# Patient Record
Sex: Female | Born: 1985 | Race: White | Hispanic: No | Marital: Single | Smoking: Never smoker
Health system: Southern US, Community
[De-identification: ages and names within clinical notes are randomized; demographics above are authoritative.]

## PROBLEM LIST (undated history)

## (undated) DIAGNOSIS — Z87442 Personal history of urinary calculi: Secondary | ICD-10-CM

## (undated) DIAGNOSIS — Z9889 Other specified postprocedural states: Secondary | ICD-10-CM

## (undated) DIAGNOSIS — J45909 Unspecified asthma, uncomplicated: Secondary | ICD-10-CM

## (undated) DIAGNOSIS — Z973 Presence of spectacles and contact lenses: Secondary | ICD-10-CM

## (undated) DIAGNOSIS — M1990 Unspecified osteoarthritis, unspecified site: Secondary | ICD-10-CM

## (undated) DIAGNOSIS — Z8679 Personal history of other diseases of the circulatory system: Secondary | ICD-10-CM

## (undated) DIAGNOSIS — N200 Calculus of kidney: Secondary | ICD-10-CM

## (undated) DIAGNOSIS — Z8742 Personal history of other diseases of the female genital tract: Secondary | ICD-10-CM

## (undated) HISTORY — PX: MYRINGOTOMY WITH TUBE PLACEMENT: SHX5663

## (undated) HISTORY — DX: Unspecified asthma, uncomplicated: J45.909

## (undated) HISTORY — PX: CARDIAC ELECTROPHYSIOLOGY MAPPING AND ABLATION: SHX1292

## (undated) HISTORY — PX: WISDOM TOOTH EXTRACTION: SHX21

---

## 2009-01-05 ENCOUNTER — Emergency Department (HOSPITAL_COMMUNITY): Admission: EM | Admit: 2009-01-05 | Discharge: 2009-01-06 | Payer: Self-pay | Admitting: Emergency Medicine

## 2009-08-13 ENCOUNTER — Encounter: Admission: RE | Admit: 2009-08-13 | Discharge: 2009-10-03 | Payer: Self-pay | Admitting: Obstetrics and Gynecology

## 2009-10-06 ENCOUNTER — Encounter: Admission: RE | Admit: 2009-10-06 | Discharge: 2009-10-13 | Payer: Self-pay | Admitting: Obstetrics and Gynecology

## 2009-10-21 ENCOUNTER — Inpatient Hospital Stay (HOSPITAL_COMMUNITY): Admission: AD | Admit: 2009-10-21 | Discharge: 2009-10-21 | Payer: Self-pay | Admitting: Obstetrics and Gynecology

## 2009-12-18 ENCOUNTER — Observation Stay (HOSPITAL_COMMUNITY): Admission: AD | Admit: 2009-12-18 | Discharge: 2009-12-20 | Payer: Self-pay | Admitting: Obstetrics and Gynecology

## 2009-12-25 ENCOUNTER — Inpatient Hospital Stay (HOSPITAL_COMMUNITY): Admission: AD | Admit: 2009-12-25 | Discharge: 2009-12-25 | Payer: Self-pay | Admitting: Obstetrics and Gynecology

## 2010-01-01 ENCOUNTER — Inpatient Hospital Stay (HOSPITAL_COMMUNITY): Admission: AD | Admit: 2010-01-01 | Discharge: 2010-01-05 | Payer: Self-pay | Admitting: Obstetrics and Gynecology

## 2010-01-02 ENCOUNTER — Encounter (INDEPENDENT_AMBULATORY_CARE_PROVIDER_SITE_OTHER): Payer: Self-pay | Admitting: Obstetrics and Gynecology

## 2010-10-04 HISTORY — PX: OTHER SURGICAL HISTORY: SHX169

## 2010-10-06 ENCOUNTER — Encounter: Admission: RE | Admit: 2010-10-06 | Payer: Self-pay | Source: Home / Self Care | Admitting: Internal Medicine

## 2010-11-04 ENCOUNTER — Encounter: Payer: Self-pay | Admitting: Internal Medicine

## 2010-12-21 LAB — URINALYSIS, ROUTINE W REFLEX MICROSCOPIC
Bilirubin Urine: NEGATIVE
Hgb urine dipstick: NEGATIVE
Nitrite: NEGATIVE
Specific Gravity, Urine: 1.01 (ref 1.005–1.030)

## 2010-12-23 LAB — CBC
Hemoglobin: 9.3 g/dL — ABNORMAL LOW (ref 12.0–15.0)
MCHC: 34.1 g/dL (ref 30.0–36.0)
RDW: 15.6 % — ABNORMAL HIGH (ref 11.5–15.5)
WBC: 14.2 10*3/uL — ABNORMAL HIGH (ref 4.0–10.5)

## 2010-12-28 LAB — CBC
MCHC: 33 g/dL (ref 30.0–36.0)
MCV: 87.2 fL (ref 78.0–100.0)
MCV: 88.2 fL (ref 78.0–100.0)
Platelets: 215 10*3/uL (ref 150–400)
RBC: 3.61 MIL/uL — ABNORMAL LOW (ref 3.87–5.11)
RDW: 14.6 % (ref 11.5–15.5)
WBC: 9.4 10*3/uL (ref 4.0–10.5)

## 2010-12-28 LAB — LACTATE DEHYDROGENASE: LDH: 122 U/L (ref 94–250)

## 2010-12-28 LAB — RPR: RPR Ser Ql: NONREACTIVE

## 2010-12-28 LAB — COMPREHENSIVE METABOLIC PANEL
ALT: 23 U/L (ref 0–35)
AST: 27 U/L (ref 0–37)
Albumin: 2.4 g/dL — ABNORMAL LOW (ref 3.5–5.2)
Alkaline Phosphatase: 105 U/L (ref 39–117)
BUN: 8 mg/dL (ref 6–23)
CO2: 22 mEq/L (ref 19–32)
GFR calc Af Amer: >60 mL/min (ref >60)
GFR calc non Af Amer: >60 mL/min (ref >60)
Potassium: 3.6 mEq/L (ref 3.5–5.1)

## 2011-01-13 LAB — URINALYSIS, ROUTINE W REFLEX MICROSCOPIC
Glucose, UA: NEGATIVE mg/dL
Specific Gravity, Urine: 1.013 (ref 1.005–1.030)
pH: 7.5 (ref 5.0–8.0)

## 2011-01-13 LAB — POCT I-STAT, CHEM 8
BUN: 7 mg/dL (ref 6–23)
Creatinine, Ser: 0.9 mg/dL (ref 0.4–1.2)
Glucose, Bld: 94 mg/dL (ref 70–99)
Hemoglobin: 12.6 g/dL (ref 12.0–15.0)
Sodium: 141 mEq/L (ref 135–145)
TCO2: 21 mmol/L (ref 0–100)

## 2011-01-13 LAB — PREGNANCY, URINE: Preg Test, Ur: NEGATIVE

## 2011-01-13 LAB — RAPID URINE DRUG SCREEN, HOSP PERFORMED: Cocaine: NOT DETECTED

## 2011-01-13 LAB — URINE MICROSCOPIC-ADD ON

## 2011-03-10 ENCOUNTER — Emergency Department (HOSPITAL_COMMUNITY)
Admission: EM | Admit: 2011-03-10 | Discharge: 2011-03-10 | Disposition: A | Discharge status: Home / Self Care | Payer: Medicaid Other | Attending: Emergency Medicine | Admitting: Emergency Medicine

## 2011-03-10 DIAGNOSIS — R059 Cough, unspecified: Secondary | ICD-10-CM | POA: Insufficient documentation

## 2011-03-10 DIAGNOSIS — J45909 Unspecified asthma, uncomplicated: Secondary | ICD-10-CM | POA: Insufficient documentation

## 2011-03-10 DIAGNOSIS — R05 Cough: Secondary | ICD-10-CM | POA: Insufficient documentation

## 2013-10-01 ENCOUNTER — Ambulatory Visit (INDEPENDENT_AMBULATORY_CARE_PROVIDER_SITE_OTHER): Payer: BC Managed Care – PPO | Admitting: Family Medicine

## 2013-10-01 VITALS — BP 99/69 | HR 108 | Temp 99.0°F | Resp 16 | Ht 63.5 in | Wt 179.0 lb

## 2013-10-01 DIAGNOSIS — J101 Influenza due to other identified influenza virus with other respiratory manifestations: Secondary | ICD-10-CM

## 2013-10-01 DIAGNOSIS — J111 Influenza due to unidentified influenza virus with other respiratory manifestations: Secondary | ICD-10-CM

## 2013-10-01 DIAGNOSIS — R509 Fever, unspecified: Secondary | ICD-10-CM

## 2013-10-01 MED ORDER — ONDANSETRON 4 MG PO TBDP
4.0000 mg | ORAL_TABLET | Freq: Once | ORAL | Status: AC
  Administered 2013-10-01: 4 mg via ORAL

## 2013-10-01 MED ORDER — OSELTAMIVIR PHOSPHATE 75 MG PO CAPS: 75.0000 mg | ORAL_CAPSULE | Freq: Two times a day (BID) | ORAL | Status: DC

## 2013-10-01 MED ORDER — HYDROCOD POLST-CHLORPHEN POLST 10-8 MG/5ML PO LQCR: 5.0000 mL | Freq: Two times a day (BID) | ORAL | Status: DC | PRN

## 2013-10-01 MED ORDER — IPRATROPIUM BROMIDE 0.03 % NA SOLN: 2.0000 | Freq: Four times a day (QID) | NASAL | Status: DC

## 2013-10-01 MED ORDER — PROMETHAZINE HCL 25 MG PO TABS: 25.0000 mg | ORAL_TABLET | Freq: Three times a day (TID) | ORAL | Status: DC | PRN

## 2013-10-01 MED ORDER — PSEUDOEPHEDRINE HCL ER 120 MG PO TB12: 120.0000 mg | ORAL_TABLET | Freq: Two times a day (BID) | ORAL | Status: DC

## 2013-10-01 NOTE — Patient Instructions (Signed)

## 2013-10-01 NOTE — Progress Notes (Signed)
Subjective:  This chart was scribed for Stefanie Sorenson, MD by Carl Best, Medical Scribe. This patient was seen in Room 10 and the patient's care was started at 9:53 AM.   Patient ID: Stefanie Everett, female    DOB: [birth year], 27 y.o.   MRN: [id number] Chief Complaint  Patient presents with  . Fever    x 2 days ; has been exposed to flu; has tried mucinex flu and cold   . Generalized Body Aches  . Cough    non productive   . Nausea    HPI HPI Comments: Stefanie Everett is a 27 y.o. female who presents to the [facility] complaining of fatigue, and myalgias that started two days ago.  She states that last night she had an episode of emesis after dinner.  She states that she woke up this morning with a fever of 101.3 degrees and chills.  She also has swollen glands, headache, nasal congestion, and non-productive cough.  No other GI/GU prob.  She states that she has not tried eating or drinking anything because she is afraid that she will be unable to keep it down.  She states that her daughter woke up with a fever this morning as well.  She states that her husband was treated at [facility] two days ago and was tested positive for Influenza.    She states that the last time she had a syncopal episode was in July when she was not feeling well.   Past Medical History  Diagnosis Date  . Allergy   . Anemia    Past Surgical History  Procedure Laterality Date  . Cesarean section  2011  . Heart ablations  2001   Family History  Problem Relation Age of Onset  . Diabetes Mother   . Hyperlipidemia Mother   . Hypertension Mother   . Emphysema Mother   . Diabetes Maternal Grandmother   . Hyperlipidemia Maternal Grandmother   . Hypertension Maternal Grandmother   . Hyperlipidemia Paternal Grandmother   . Hypertension Paternal Grandmother   . Heart disease Paternal Grandfather    History   Social History  . Marital Status: Single    Spouse Name: N/A    Number of Children: N/A  .  Years of Education: N/A   Occupational History  . Not on file.   Social History Main Topics  . Smoking status: Never Smoker   . Smokeless tobacco: Not on file  . Alcohol Use: No  . Drug Use: No  . Sexual Activity: Not on file   Other Topics Concern  . Not on file   Social History Narrative  . No narrative on file   Allergies  Allergen Reactions  . Penicillins Swelling  . Toradol [Ketorolac Tromethamine] Swelling    Review of Systems  Constitutional: Positive for fever, chills, diaphoresis, activity change, appetite change and fatigue. Negative for unexpected weight change.  HENT: Positive for congestion, postnasal drip and rhinorrhea.   Respiratory: Positive for cough.   Gastrointestinal: Positive for nausea and vomiting. Negative for abdominal pain and diarrhea.  Genitourinary: Negative for dysuria, decreased urine volume and difficulty urinating.  Musculoskeletal: Positive for arthralgias and myalgias.  Neurological: Positive for syncope and headaches.  Hematological: Positive for adenopathy.     BP 118/72  Pulse 106  Temp(Src) 99 F (37.2 C) (Oral)  Resp 16  Ht 5' 3.5" (1.613 m)  Wt 179 lb (81.194 kg)  BMI 31.21 kg/m2  SpO2 99% Objective:  Physical  Exam  Nursing note and vitals reviewed. Constitutional: She is oriented to person, place, and time. She appears well-developed and well-nourished. No distress.  HENT:  Head: Normocephalic and atraumatic.  Right Ear: Hearing, external ear and ear canal normal. Tympanic membrane is scarred. A middle ear effusion is present.  Left Ear: Hearing, external ear and ear canal normal. Tympanic membrane is scarred. A middle ear effusion is present.  Nose: Mucosal edema and rhinorrhea present.  Mouth/Throat: Uvula is midline and oropharynx is clear and moist.  Eyes: Conjunctivae are normal. Pupils are equal, round, and reactive to light.  Neck: Normal range of motion. Neck supple. No thyromegaly present.  Cardiovascular:  Regular rhythm and normal heart sounds.  Tachycardia present.  Exam reveals no gallop and no friction rub.   No murmur heard. Pulmonary/Chest: Effort normal and breath sounds normal. No respiratory distress. She has no decreased breath sounds. She has no wheezes. She has no rhonchi. She has no rales.  Lymphadenopathy:    She has no cervical adenopathy.  Neurological: She is alert and oriented to person, place, and time.  Skin: Skin is warm and dry.  Psychiatric: She has a normal mood and affect. Her behavior is normal.    Results for orders placed in visit on 10/01/13  POCT INFLUENZA A/B      Result Value Range   Influenza A, POC Positive     Influenza B, POC Negative     Had a vasovagal episode after the flu test - recovered spontaneously and husband came to pick her up from clinic.  Assessment & Plan:   Fever, unspecified - Plan: POCT Influenza A/B, ondansetron (ZOFRAN-ODT) disintegrating tablet 4 mg  Influenza A - Plan: ondansetron (ZOFRAN-ODT) disintegrating tablet 4 mg  Meds ordered this encounter  Medications  . albuterol (PROVENTIL) (2.5 MG/3ML) 0.083% nebulizer solution    Sig: Take 2.5 mg by nebulization every 6 (six) hours as needed for wheezing or shortness of breath.  . chlorpheniramine-HYDROcodone (TUSSIONEX PENNKINETIC ER) 10-8 MG/5ML LQCR    Sig: Take 5 mLs by mouth every 12 (twelve) hours as needed.    Dispense:  120 mL    Refill:  0  . pseudoephedrine (SUDAFED 12 HOUR) 120 MG 12 hr tablet    Sig: Take 1 tablet (120 mg total) by mouth 2 (two) times daily.    Dispense:  30 tablet    Refill:  0  . ipratropium (ATROVENT) 0.03 % nasal spray    Sig: Place 2 sprays into the nose 4 (four) times daily.    Dispense:  30 mL    Refill:  1  . oseltamivir (TAMIFLU) 75 MG capsule    Sig: Take 1 capsule (75 mg total) by mouth 2 (two) times daily.    Dispense:  10 capsule    Refill:  0  . promethazine (PHENERGAN) 25 MG tablet    Sig: Take 1 tablet (25 mg total) by mouth  every 8 (eight) hours as needed for nausea or vomiting.    Dispense:  20 tablet    Refill:  0  . ondansetron (ZOFRAN-ODT) disintegrating tablet 4 mg    Sig:     I personally performed the services described in this documentation, which was scribed in my presence. The recorded information has been reviewed and considered, and addended by me as needed.  Stefanie Sorenson, MD MPH

## 2013-10-29 ENCOUNTER — Ambulatory Visit (INDEPENDENT_AMBULATORY_CARE_PROVIDER_SITE_OTHER): Payer: BC Managed Care – PPO | Admitting: Physician Assistant

## 2013-10-29 VITALS — BP 116/70 | HR 97 | Temp 98.3°F | Resp 18 | Ht 63.5 in | Wt 180.0 lb

## 2013-10-29 DIAGNOSIS — R05 Cough: Secondary | ICD-10-CM

## 2013-10-29 DIAGNOSIS — R059 Cough, unspecified: Secondary | ICD-10-CM

## 2013-10-29 DIAGNOSIS — J029 Acute pharyngitis, unspecified: Secondary | ICD-10-CM

## 2013-10-29 LAB — POCT RAPID STREP A (OFFICE): Rapid Strep A Screen: NEGATIVE

## 2013-10-29 MED ORDER — BENZONATATE 100 MG PO CAPS: 100.0000 mg | ORAL_CAPSULE | Freq: Three times a day (TID) | ORAL | Status: DC | PRN

## 2013-10-29 MED ORDER — AZITHROMYCIN 250 MG PO TABS: ORAL_TABLET | ORAL | Status: DC

## 2013-10-29 NOTE — Patient Instructions (Signed)
Get plenty of rest and drink at least 64 ounces of water daily. 

## 2013-10-29 NOTE — Progress Notes (Signed)
Subjective:    Patient ID: Stefanie Everett, female    DOB: April 12, 1986, 28 y.o.   MRN: [id number]  PCP: No primary provider on file.  Chief Complaint  Patient presents with  . Sore Throat    x3 days   . Cough  . Migraine  Medications, allergies, past medical history, surgical history, family history, social history and problem list reviewed and updated.   HPI  Diagnosed with influenza A on 10/01/2013. Symptoms resolved.  ABout 3 days ago developed severe sore throat.  "Feels like I'm swallowing rocks."  No fever.  A little achy. No chills.  No nausea. She has a cough that's non-productive, a little bit of a runny nose.  Her boyfriend was diagnosed with a bacterial pharyngitis yesterday at another facility.  She believes that she has the same thing.  Review of Systems No GI/GU symptoms.  As above.    Objective:   Physical Exam  Vitals reviewed. Constitutional: She is oriented to person, place, and time. Vital signs are normal. She appears well-developed and well-nourished. No distress.  HENT:  Head: Normocephalic and atraumatic.  Right Ear: Hearing, tympanic membrane, external ear and ear canal normal.  Left Ear: Hearing, tympanic membrane, external ear and ear canal normal.  Nose: Mucosal edema and rhinorrhea present.  No foreign bodies. Right sinus exhibits no maxillary sinus tenderness and no frontal sinus tenderness. Left sinus exhibits no maxillary sinus tenderness and no frontal sinus tenderness.  Mouth/Throat: Uvula is midline, oropharynx is clear and moist and mucous membranes are normal. No uvula swelling. No oropharyngeal exudate.  Eyes: Conjunctivae and EOM are normal. Pupils are equal, round, and reactive to light. Right eye exhibits no discharge. Left eye exhibits no discharge. No scleral icterus.  Neck: Trachea normal, normal range of motion and full passive range of motion without pain. Neck supple. No mass and no thyromegaly present.  Cardiovascular: Normal rate,  regular rhythm and normal heart sounds.   Pulmonary/Chest: Effort normal and breath sounds normal.  Lymphadenopathy:       Head (right side): No submandibular, no tonsillar, no preauricular, no posterior auricular and no occipital adenopathy present.       Head (left side): No submandibular, no tonsillar, no preauricular and no occipital adenopathy present.    She has no cervical adenopathy.       Right: No supraclavicular adenopathy present.       Left: No supraclavicular adenopathy present.  Neurological: She is alert and oriented to person, place, and time. She has normal strength. No cranial nerve deficit or sensory deficit.  Skin: Skin is warm, dry and intact. No rash noted.  Psychiatric: She has a normal mood and affect. Her speech is normal and behavior is normal.      Results for orders placed in visit on 10/29/13  POCT RAPID STREP A (OFFICE)      Result Value Range   Rapid Strep A Screen Negative  Negative       Assessment & Plan:  1. Acute pharyngitis Await culture.  Suspect viral URI, but given her exposure to possible strep, agree to cover with antibiotic.  PCN allergic. - POCT rapid strep A - Culture, Group A Strep - azithromycin (ZITHROMAX) 250 MG tablet; Take 2 tabs PO x 1 dose, then 1 tab PO QD x 4 days  Dispense: 6 tablet; Refill: 0  2. Cough Likely due to post-nasal drainage from URI - benzonatate (TESSALON) 100 MG capsule; Take 1-2 capsules (100-200 mg total) by mouth  3 (three) times daily as needed for cough.  Dispense: 40 capsule; Refill: 0   Fernande Brashelle S. Andromeda Poppen, PA-C Physician Assistant-Certified [facility]

## 2013-11-01 LAB — CULTURE, GROUP A STREP: Organism ID, Bacteria: NORMAL

## 2014-08-12 ENCOUNTER — Ambulatory Visit (INDEPENDENT_AMBULATORY_CARE_PROVIDER_SITE_OTHER): Payer: BC Managed Care – PPO | Admitting: Physician Assistant

## 2014-08-12 VITALS — BP 116/74 | HR 77 | Temp 98.2°F | Resp 16 | Ht 64.5 in | Wt 180.6 lb

## 2014-08-12 DIAGNOSIS — J029 Acute pharyngitis, unspecified: Secondary | ICD-10-CM

## 2014-08-12 DIAGNOSIS — G44209 Tension-type headache, unspecified, not intractable: Secondary | ICD-10-CM

## 2014-08-12 DIAGNOSIS — R112 Nausea with vomiting, unspecified: Secondary | ICD-10-CM

## 2014-08-12 MED ORDER — ONDANSETRON 4 MG PO TBDP: 4.0000 mg | ORAL_TABLET | Freq: Three times a day (TID) | ORAL | Status: DC | PRN

## 2014-08-12 NOTE — Progress Notes (Signed)
I have discussed this case with Mr. McVeigh, PA-C and agree.  

## 2014-08-12 NOTE — Patient Instructions (Signed)
I do not think your sore throat or abdominal issues are due to a bacterial infection.  For your sore throat and headache, cycle between aleve and tylenol every 4 hours as needed. Take the zofran as needed as prescribed for the nausea/vomiting. Try to rest and stay hydrated as much as possible! 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 If you're not feeling better in 5 days please return to clinic. If your vomiting is not easing up please return to clinic in 48 hours.

## 2014-08-12 NOTE — Progress Notes (Signed)
Subjective:    Patient ID: Stefanie Everett, female    DOB: 07/22/1986, 28 y.o.   MRN: [id number]  No PCP Per Patient  Chief Complaint  Patient presents with  . Sore Throat  . Emesis  . Headache   There are no active problems to display for this patient.  Prior to Admission medications   Medication Sig Start Date End Date Taking? Authorizing Provider  albuterol (PROVENTIL) (2.5 MG/3ML) 0.083% nebulizer solution Take 2.5 mg by nebulization every 6 (six) hours as needed for wheezing or shortness of breath.   Yes Historical Provider, MD  ondansetron (ZOFRAN ODT) 4 MG disintegrating tablet Take 1 tablet (4 mg total) by mouth every 8 (eight) hours as needed for nausea or vomiting. 08/12/14   Raelyn Ensignodd Jimmye Wisnieski, PA   Medications, allergies, past medical history, surgical history, family history, social history and problem list reviewed and updated.  HPI  89 yof with no sig PMH presents today with sore throat, HA, and vomiting.  Sx initially began with sore throat 4-5 days ago. She also had some assoc head congestion. Denies ear pain, runny nose, cough. One temp at home elevated at 100.0, otherwise she has been without fever or chills. Her sore throat eased off after 1-2 days but returned last night when she started vomiting. Daughter was sick last week at home.   Vomiting - Ate at a fast food restaurant yest evening. About 1.5 hrs later was driving home and had to pull over to vomit in a parking lot. She has had 2 episodes of vomiting today. Non-bloody,non-billious. She ate crackers several hrs ago and was able to keep these down. Upset stomach but denies actual abd pain, diarrhea.   HA - Started last night after vomiting. Has been located across her head, rated 5/10 at worst. Relieved with aleve yest but has come back today. No fever since HA began, no vision changes, photo/phonophobia.   Review of Systems No CP, SOB, chills, dysuria. See HPI.     Objective:   Physical Exam  Constitutional: She  is oriented to person, place, and time. She appears well-developed and well-nourished.  Non-toxic appearance. She does not have a sickly appearance. She appears ill. No distress.  BP 116/74 mmHg  Pulse 77  Temp(Src) 98.2 F (36.8 C) (Oral)  Resp 16  Ht 5' 4.5" (1.638 m)  Wt 180 lb 9.6 oz (81.92 kg)  BMI 30.53 kg/m2  SpO2 100%  LMP 07/21/2014   HENT:  Head: Normocephalic and atraumatic.  Right Ear: Hearing, tympanic membrane, external ear and ear canal normal.  Left Ear: Hearing, tympanic membrane, external ear and ear canal normal.  Nose: Nose normal. No mucosal edema or rhinorrhea. Right sinus exhibits no maxillary sinus tenderness and no frontal sinus tenderness. Left sinus exhibits no maxillary sinus tenderness and no frontal sinus tenderness.  Mouth/Throat: Uvula is midline, oropharynx is clear and moist and mucous membranes are normal. No oropharyngeal exudate, posterior oropharyngeal edema, posterior oropharyngeal erythema or tonsillar abscesses.  Eyes: Conjunctivae and EOM are normal. Pupils are equal, round, and reactive to light.  Neck: No Brudzinski's sign noted.  Cardiovascular: Normal rate, regular rhythm and normal heart sounds.  Exam reveals no gallop.   No murmur heard. Pulmonary/Chest: Effort normal. She has no decreased breath sounds. She has no wheezes. She has no rhonchi. She has no rales.  Abdominal: Soft. Normal appearance and bowel sounds are normal. There is no tenderness.  Lymphadenopathy:       Head (right side):  No submental, no submandibular and no tonsillar adenopathy present.       Head (left side): No submental, no submandibular and no tonsillar adenopathy present.    She has no cervical adenopathy.  Neurological: She is alert and oriented to person, place, and time.  Psychiatric: She has a normal mood and affect. Her speech is normal.      Assessment & Plan:   [age] yof with no sig PMH presents today with sore throat, HA, and vomiting.  Tension  headache --Most likely from vomiting, no concerning features --Tylenol/aleve  --RTC 5 days if not resolving  Nausea and vomiting, vomiting of unspecified type - Plan: ondansetron (ZOFRAN ODT) 4 MG disintegrating tablet --Food poisoning vs gastroenteritis --Zofran prn --Rest/fluids --RTC 48 hrs if not resolving  Sore throat --PE unconcerning for strep --Tylenol/nsaids/fluids/rest --RTC 5 days if not resolving  Donnajean Lopesodd M. Alexsys Eskin, PA-C Physician Assistant-Certified [facility]  08/12/2014 2:20 PM

## 2014-08-13 ENCOUNTER — Ambulatory Visit (INDEPENDENT_AMBULATORY_CARE_PROVIDER_SITE_OTHER): Payer: BC Managed Care – PPO | Admitting: Internal Medicine

## 2014-08-13 ENCOUNTER — Ambulatory Visit (INDEPENDENT_AMBULATORY_CARE_PROVIDER_SITE_OTHER): Payer: BC Managed Care – PPO

## 2014-08-13 VITALS — BP 118/82 | HR 89 | Temp 98.3°F | Resp 16 | Ht 64.5 in | Wt 179.6 lb

## 2014-08-13 DIAGNOSIS — R1032 Left lower quadrant pain: Secondary | ICD-10-CM

## 2014-08-13 DIAGNOSIS — R319 Hematuria, unspecified: Secondary | ICD-10-CM

## 2014-08-13 LAB — POCT URINALYSIS DIPSTICK
Bilirubin, UA: NEGATIVE
Glucose, UA: NEGATIVE
KETONES UA: NEGATIVE
Leukocytes, UA: NEGATIVE
Nitrite, UA: NEGATIVE
PROTEIN UA: 100
Urobilinogen, UA: 0.2
pH, UA: 5.5

## 2014-08-13 LAB — COMPREHENSIVE METABOLIC PANEL
ALT: 12 U/L (ref 0–35)
AST: 14 U/L (ref 0–37)
Albumin: 4 g/dL (ref 3.5–5.2)
Alkaline Phosphatase: 74 U/L (ref 39–117)
BUN: 10 mg/dL (ref 6–23)
CO2: 24 meq/L (ref 19–32)
Calcium: 9.1 mg/dL (ref 8.4–10.5)
Chloride: 103 mEq/L (ref 96–112)
Creat: 0.75 mg/dL (ref 0.50–1.10)
GLUCOSE: 76 mg/dL (ref 70–99)
Potassium: 4.1 mEq/L (ref 3.5–5.3)
SODIUM: 137 meq/L (ref 135–145)
TOTAL PROTEIN: 7.1 g/dL (ref 6.0–8.3)
Total Bilirubin: 0.7 mg/dL (ref 0.2–1.2)

## 2014-08-13 LAB — POCT CBC
Granulocyte percent: 65.7 %G (ref 37–80)
HEMATOCRIT: 38.7 % (ref 37.7–47.9)
HEMOGLOBIN: 12.1 g/dL — AB (ref 12.2–16.2)
Lymph, poc: 2.9 (ref 0.6–3.4)
MCH, POC: 27 pg (ref 27–31.2)
MCHC: 31.3 g/dL — AB (ref 31.8–35.4)
MCV: 86.3 fL (ref 80–97)
MID (cbc): 0.4 (ref 0–0.9)
MPV: 8.6 fL (ref 0–99.8)
POC Granulocyte: 6.3 (ref 2–6.9)
POC LYMPH PERCENT: 30.4 %L (ref 10–50)
POC MID %: 3.9 %M (ref 0–12)
Platelet Count, POC: 252 10*3/uL (ref 142–424)
RBC: 4.48 M/uL (ref 4.04–5.48)
RDW, POC: 14.4 %
WBC: 9.6 10*3/uL (ref 4.6–10.2)

## 2014-08-13 LAB — POCT UA - MICROSCOPIC ONLY
CRYSTALS, UR, HPF, POC: NEGATIVE
Casts, Ur, LPF, POC: NEGATIVE
MUCUS UA: NEGATIVE
Yeast, UA: NEGATIVE

## 2014-08-13 MED ORDER — HYDROCODONE-ACETAMINOPHEN 5-325 MG PO TABS: 1.0000 | ORAL_TABLET | Freq: Four times a day (QID) | ORAL | Status: DC | PRN

## 2014-08-13 NOTE — Progress Notes (Addendum)
Subjective:  This chart was scribed for [name], MD by Carl Bestelina Holson, Medical Scribe. This patient was seen in Room 11 and the patient's care was started at 10:44 AM.   Patient ID: Stefanie Everett, female    DOB: 10/18/1985, [age] y.o.   MRN: [id number]  HPI HPI Comments: Stefanie Everett is a 28 y.o. female who presents to the [facility] for a follow-up concerning appearance of hematuria.  She was here yesterday for her symptoms and was discharged with Zofran and instructions to follow-up in 24 hours.  Last night she noticed some mild hematuria and pain in her left flank.  She is still complaining of the headache she had yesterday but states that it has worsened.  Her headache is not affecting her vision.  She vomited twice last night and used Zofran with relief to her symptoms.  She denies fever, frequency, and dysuria as associated symptoms.  She does not have a history of kidney stones but states that both of her parents have a history of kidney stones.     Past Medical History  Diagnosis Date   Allergy    Anemia    Past Surgical History  Procedure Laterality Date   Cesarean section  2011   Heart ablations  2001   Myringotomy with tube placement     Dilation and curettage of uterus     Laparoscopic abdominal exploration      endometriosis   Family History  Problem Relation Age of Onset   Diabetes Mother    Hyperlipidemia Mother    Hypertension Mother    Emphysema Mother    Diabetes Maternal Grandmother    Hyperlipidemia Maternal Grandmother    Hypertension Maternal Grandmother    Hyperlipidemia Paternal Grandmother    Hypertension Paternal Grandmother    Heart disease Paternal Grandfather    History   Social History   Marital Status: Single    Spouse Name: n/a    Number of Children: 1   Years of Education: N/A   Occupational History   Development worker, communityfinancial services representative     [employer]   Social History Main  Topics   Smoking status: Never Smoker    Smokeless tobacco: Never Used   Alcohol Use: No   Drug Use: No   Sexual Activity: Not on file   Other Topics Concern   Not on file   Social History Narrative   Lives with her boyfriend and their daughter.   Allergies  Allergen Reactions   Penicillins Swelling   Toradol [Ketorolac Tromethamine] Swelling    Review of Systems  Constitutional: Negative for fever.  Gastrointestinal: Positive for vomiting.  Genitourinary: Positive for hematuria and decreased urine volume. Negative for dysuria and frequency.  Neurological: Positive for headaches.     Objective:  Physical Exam  Constitutional: She is oriented to person, place, and time. She appears well-developed and well-nourished.  HENT:  Head: Normocephalic and atraumatic.  Eyes: Conjunctivae and EOM are normal. Pupils are equal, round, and reactive to light.  Neck: Normal range of motion. Neck supple.  Cardiovascular: Normal rate, regular rhythm and normal heart sounds.   No murmur heard. Pulmonary/Chest: Effort normal.  Abdominal: Soft. Bowel sounds are normal. There is tenderness. There is CVA tenderness.  She has tenderness to palpation in the LLQ and left middle quadrant.  She has positive CVA tenderness to percussion on the left.  Musculoskeletal: Normal range of motion.  Neurological: She is alert and oriented to  person, place, and time.  Skin: Skin is warm and dry.  Psychiatric: She has a normal mood and affect. Her behavior is normal.  Nursing note and vitals reviewed.   [facility] reading (PRIMARY) by  Dr. Doolittle=NAD-Stone L kidney-small    BP 118/82 mmHg   Pulse 89   Temp(Src) 98.3 F (36.8 C) (Oral)   Resp 16   Ht 5' 4.5" (1.638 m)   Wt 179 lb 9.6 oz (81.466 kg)   BMI 30.36 kg/m2   SpO2 100%   LMP 07/21/2014 Assessment & Plan:  Blood in urine - Plan: POCT UA - Microscopic Only, POCT urinalysis dipstick  Abdominal pain, LLQ - Plan: Comprehensive metabolic  panel, POCT CBC  Hematuria - Plan: DG Abd 1 View  Probable stone #1  Meds ordered this encounter  Medications   HYDROcodone-acetaminophen (NORCO/VICODIN) 5-325 MG per tablet    Sig: Take 1 tablet by mouth every 6 (six) hours as needed for moderate pain.    Dispense:  30 tablet    Refill:  0  hx all rx to toradol Has zofran  If not stable by wed pm thurs am--CT urogram   I personally performed the services described in this documentation, which was scribed in my presence. The recorded information has been reviewed and is accurate.

## 2014-08-15 ENCOUNTER — Telehealth: Payer: Self-pay

## 2014-08-15 DIAGNOSIS — R300 Dysuria: Secondary | ICD-10-CM

## 2014-08-15 NOTE — Telephone Encounter (Signed)
Patient called to return a call. Please call back and advise. CB # [phone]

## 2014-08-15 NOTE — Telephone Encounter (Signed)
Pt saw Dr. Merla Richesoolittle on Tuesdays for kidney stones, and was told to call back if she had not passed  The stones by Thursday because he might want to schedule her a Ct scan. Pt states that she has not yet passed these stones. Please advise pt.

## 2014-08-15 NOTE — Telephone Encounter (Signed)
Pt notified that this has been ordered.

## 2014-08-15 NOTE — Telephone Encounter (Signed)
Pt report she has not passed stones and is still in pain. CT ordered.

## 2014-08-15 NOTE — Telephone Encounter (Signed)
Good call.

## 2014-08-16 ENCOUNTER — Telehealth: Payer: Self-pay

## 2014-08-16 NOTE — Addendum Note (Signed)
Addended by: Johnnette LitterARDWELL, Chanler Mendonca M on: 08/16/2014 04:15 PM   Modules accepted: Orders

## 2014-08-16 NOTE — Telephone Encounter (Signed)
Error

## 2014-08-19 ENCOUNTER — Ambulatory Visit (HOSPITAL_COMMUNITY)
Admission: RE | Admit: 2014-08-19 | Discharge: 2014-08-19 | Disposition: A | Discharge status: Home / Self Care | Payer: BC Managed Care – PPO | Source: Ambulatory Visit | Attending: Internal Medicine | Admitting: Internal Medicine

## 2014-08-19 ENCOUNTER — Ambulatory Visit (HOSPITAL_COMMUNITY): Payer: BC Managed Care – PPO

## 2014-08-19 DIAGNOSIS — R319 Hematuria, unspecified: Secondary | ICD-10-CM | POA: Diagnosis not present

## 2014-08-19 DIAGNOSIS — R1032 Left lower quadrant pain: Secondary | ICD-10-CM | POA: Diagnosis not present

## 2014-09-02 ENCOUNTER — Ambulatory Visit (INDEPENDENT_AMBULATORY_CARE_PROVIDER_SITE_OTHER): Payer: BC Managed Care – PPO | Admitting: Family Medicine

## 2014-09-02 VITALS — BP 100/68 | HR 87 | Temp 98.0°F | Resp 18 | Ht 64.0 in | Wt 178.0 lb

## 2014-09-02 DIAGNOSIS — R062 Wheezing: Secondary | ICD-10-CM

## 2014-09-02 DIAGNOSIS — J9801 Acute bronchospasm: Secondary | ICD-10-CM

## 2014-09-02 DIAGNOSIS — J209 Acute bronchitis, unspecified: Secondary | ICD-10-CM

## 2014-09-02 DIAGNOSIS — R059 Cough, unspecified: Secondary | ICD-10-CM

## 2014-09-02 DIAGNOSIS — J45909 Unspecified asthma, uncomplicated: Secondary | ICD-10-CM

## 2014-09-02 DIAGNOSIS — R05 Cough: Secondary | ICD-10-CM

## 2014-09-02 DIAGNOSIS — J0110 Acute frontal sinusitis, unspecified: Secondary | ICD-10-CM

## 2014-09-02 MED ORDER — AZITHROMYCIN 250 MG PO TABS: ORAL_TABLET | ORAL | Status: DC

## 2014-09-02 MED ORDER — METHYLPREDNISOLONE (PAK) 4 MG PO TABS: ORAL_TABLET | ORAL | Status: DC

## 2014-09-02 MED ORDER — BENZONATATE 100 MG PO CAPS: 200.0000 mg | ORAL_CAPSULE | Freq: Two times a day (BID) | ORAL | Status: DC | PRN

## 2014-09-02 MED ORDER — ALBUTEROL SULFATE HFA 108 (90 BASE) MCG/ACT IN AERS
2.0000 | INHALATION_SPRAY | Freq: Four times a day (QID) | RESPIRATORY_TRACT | Status: AC | PRN
Start: 2014-09-02 — End: ?

## 2014-09-02 MED ORDER — ALBUTEROL SULFATE (2.5 MG/3ML) 0.083% IN NEBU
2.5000 mg | INHALATION_SOLUTION | Freq: Once | RESPIRATORY_TRACT | Status: AC
  Administered 2014-09-02: 2.5 mg via RESPIRATORY_TRACT

## 2014-09-02 MED ORDER — IPRATROPIUM BROMIDE 0.02 % IN SOLN
0.5000 mg | Freq: Once | RESPIRATORY_TRACT | Status: AC
Start: 2014-09-02 — End: 2014-09-02
  Administered 2014-09-02: 0.5 mg via RESPIRATORY_TRACT

## 2014-09-02 MED ORDER — ALBUTEROL SULFATE (2.5 MG/3ML) 0.083% IN NEBU
2.5000 mg | INHALATION_SOLUTION | Freq: Four times a day (QID) | RESPIRATORY_TRACT | Status: AC | PRN
Start: 2014-09-02 — End: ?

## 2014-09-02 MED ORDER — IPRATROPIUM BROMIDE 0.02 % IN SOLN: 0.5000 mg | Freq: Four times a day (QID) | RESPIRATORY_TRACT | Status: AC | PRN

## 2014-09-02 MED ORDER — HYDROCODONE-HOMATROPINE 5-1.5 MG/5ML PO SYRP: 5.0000 mL | ORAL_SOLUTION | Freq: Every evening | ORAL | Status: DC | PRN

## 2014-09-02 NOTE — Patient Instructions (Signed)

## 2014-09-02 NOTE — Progress Notes (Signed)
 Chief Complaint:  Chief Complaint  Patient presents with  . Cough    x1 week worse the past 3 days   . Shortness of Breath    using inhaler every 4hrs     HPI: Stefanie Everett is a 28 y.o. female who is here for  1 week history of cough, but she is having wheezing , she ahs a 621 year old daughter with URI sxs, she feels like she is having a mini asthma attack, she  Has used her inhaler but inhaler is expired, she does have asthma and allergies, she has ashma  flare up when she is sick. She used her daughter's inhaler and it did not work , 2 puffs every 4 hours for 4 days.  She took an albuterol neb treatment yesterday, also has tried mucinex cough and cold, she has tried tylenol  And made her cough worse. No feers or chills.   Past Medical History  Diagnosis Date  . Allergy   . Anemia   . Asthma    Past Surgical History  Procedure Laterality Date  . Cesarean section  2011  . Heart ablations  2001  . Myringotomy with tube placement    . Dilation and curettage of uterus    . Laparoscopic abdominal exploration      endometriosis   History   Social History  . Marital Status: Significant Other    Spouse Name: n/a    Number of Children: 1  . Years of Education: N/A   Occupational History  . financial Environmental consultantservices representative     [employer]   Social History Main Topics  . Smoking status: Never Smoker   . Smokeless tobacco: Never Used  . Alcohol Use: No  . Drug Use: No  . Sexual Activity: None   Other Topics Concern  . None   Social History Narrative   Lives with her boyfriend and their daughter.   Family History  Problem Relation Age of Onset  . Diabetes Mother   . Hyperlipidemia Mother   . Hypertension Mother   . Emphysema Mother   . Diabetes Maternal Grandmother   . Hyperlipidemia Maternal Grandmother   . Hypertension Maternal Grandmother   . Hyperlipidemia Paternal Grandmother   . Hypertension Paternal Grandmother   . Heart disease  Paternal Grandfather    Allergies  Allergen Reactions  . Penicillins Swelling  . Toradol [Ketorolac Tromethamine] Swelling   Prior to Admission medications   Medication Sig Start Date End Date Taking? Authorizing Provider  albuterol (PROVENTIL HFA;VENTOLIN HFA) 108 (90 BASE) MCG/ACT inhaler Inhale 2 puffs into the lungs every 4 (four) hours as needed for wheezing or shortness of breath.   Yes Historical Provider, MD  albuterol (PROVENTIL) (2.5 MG/3ML) 0.083% nebulizer solution Take 2.5 mg by nebulization every 6 (six) hours as needed for wheezing or shortness of breath.   Yes Historical Provider, MD  HYDROcodone-acetaminophen (NORCO/VICODIN) 5-325 MG per tablet Take 1 tablet by mouth every 6 (six) hours as needed for moderate pain. 08/13/14  Yes Tonye Pearsonobert P Doolittle, MD  ondansetron (ZOFRAN ODT) 4 MG disintegrating tablet Take 1 tablet (4 mg total) by mouth every 8 (eight) hours as needed for nausea or vomiting. 08/12/14  Yes Todd McVeigh, PA     ROS: The patient denies night sweats, unintentional weight loss, chest pain, palpitations,  nausea, vomiting, abdominal pain, dysuria, hematuria, melena, numbness, weakness, or tingling.   All other systems have been reviewed and were otherwise  negative with the exception of those mentioned in the HPI and as above.    PHYSICAL EXAM: Filed Vitals:   09/02/14 1006  BP: 100/68  Pulse: 87  Temp: 98 F (36.7 C)  Resp: 18   Filed Vitals:   09/02/14 1006  Height: 5\' 4"  (1.626 m)  Weight: 178 lb (80.74 kg)   Body mass index is 30.54 kg/(m^2).  General: Alert, no acute distress HEENT:  Normocephalic, atraumatic, oropharynx patent. EOMI, PERRLA, TM nl, no exudates Cardiovascular:  Regular rate and rhythm, no rubs murmurs or gallops.  No Carotid bruits, radial pulse intact. No pedal edema.  Respiratory: + wheezes, No  rales, or rhonchi.  No cyanosis, no use of accessory musculature GI: No organomegaly, abdomen is soft and non-tender, positive bowel  sounds.  No masses. Skin: No rashes. Neurologic: Facial musculature symmetric. Psychiatric: Patient is appropriate throughout our interaction. Lymphatic: No cervical lymphadenopathy Musculoskeletal: Gait intact.   LABS: Results for orders placed or performed in visit on 08/13/14  Comprehensive metabolic panel  Result Value Ref Range   Sodium 137 135 - 145 mEq/L   Potassium 4.1 3.5 - 5.3 mEq/L   Chloride 103 96 - 112 mEq/L   CO2 24 19 - 32 mEq/L   Glucose, Bld 76 70 - 99 mg/dL   BUN 10 6 - 23 mg/dL   Creat 1.610.75 0.960.50 - 0.451.10 mg/dL   Total Bilirubin 0.7 0.2 - 1.2 mg/dL   Alkaline Phosphatase 74 39 - 117 U/L   AST 14 0 - 37 U/L   ALT 12 0 - 35 U/L   Total Protein 7.1 6.0 - 8.3 g/dL   Albumin 4.0 3.5 - 5.2 g/dL   Calcium 9.1 8.4 - 40.910.5 mg/dL  POCT UA - Microscopic Only  Result Value Ref Range   WBC, Ur, HPF, POC 0-1    RBC, urine, microscopic tntc    Bacteria, U Microscopic trace    Mucus, UA neg    Epithelial cells, urine per micros 1-3    Crystals, Ur, HPF, POC neg    Casts, Ur, LPF, POC neg    Yeast, UA neg   POCT urinalysis dipstick  Result Value Ref Range   Color, UA dark yellow    Clarity, UA turbid    Glucose, UA neg    Bilirubin, UA neg    Ketones, UA neg    Spec Grav, UA >=1.030    Blood, UA large    pH, UA 5.5    Protein, UA 100    Urobilinogen, UA 0.2    Nitrite, UA neg    Leukocytes, UA Negative   POCT CBC  Result Value Ref Range   WBC 9.6 4.6 - 10.2 K/uL   Lymph, poc 2.9 0.6 - 3.4   POC LYMPH PERCENT 30.4 10 - 50 %L   MID (cbc) 0.4 0 - 0.9   POC MID % 3.9 0 - 12 %M   POC Granulocyte 6.3 2 - 6.9   Granulocyte percent 65.7 37 - 80 %G   RBC 4.48 4.04 - 5.48 M/uL   Hemoglobin 12.1 (A) 12.2 - 16.2 g/dL   HCT, POC 81.138.7 91.437.7 - 47.9 %   MCV 86.3 80 - 97 fL   MCH, POC 27.0 27 - 31.2 pg   MCHC 31.3 (A) 31.8 - 35.4 g/dL   RDW, POC 78.214.4 %   Platelet Count, POC 252 142 - 424 K/uL   MPV 8.6 0 - 99.8 fL     EKG/XRAY:  Primary read interpreted by Dr. Conley Rolls  at [facility].   ASSESSMENT/PLAN: Encounter Diagnoses  Name Primary?  . Acute frontal sinusitis, recurrence not specified Yes  . Asthma, chronic, unspecified asthma severity, uncomplicated   . Bronchospasm   . Wheezing   . Cough   . Acute bronchitis, unspecified organism    She ahs nebulizer machine at home Will rx atrovent and albterol nebs to do at home scheduled until improved She can use the inhaler prn when she is out She felt better after taking duoneb in office but [place] still low Rx z pack, medrol dose pack, tessalon perles, hycodan and atrovent albuterol nebs Fu prn   Gross sideeffects, risk and benefits, and alternatives of medications d/w patient. Patient is aware that all medications have potential sideeffects and we are unable to predict every sideeffect or drug-drug interaction that may occur.  ,  PHUONG, DO 09/02/2014 11:10 AM

## 2014-09-12 ENCOUNTER — Ambulatory Visit (INDEPENDENT_AMBULATORY_CARE_PROVIDER_SITE_OTHER): Payer: BC Managed Care – PPO | Admitting: Family Medicine

## 2014-09-12 VITALS — BP 110/70 | HR 89 | Temp 98.4°F | Resp 16 | Ht 64.0 in | Wt 177.0 lb

## 2014-09-12 DIAGNOSIS — R1110 Vomiting, unspecified: Secondary | ICD-10-CM

## 2014-09-12 DIAGNOSIS — N809 Endometriosis, unspecified: Secondary | ICD-10-CM

## 2014-09-12 DIAGNOSIS — R1084 Generalized abdominal pain: Secondary | ICD-10-CM

## 2014-09-12 DIAGNOSIS — N200 Calculus of kidney: Secondary | ICD-10-CM

## 2014-09-12 LAB — COMPREHENSIVE METABOLIC PANEL
ALT: 14 U/L (ref 0–35)
AST: 12 U/L (ref 0–37)
Albumin: 4.1 g/dL (ref 3.5–5.2)
Alkaline Phosphatase: 69 U/L (ref 39–117)
BUN: 9 mg/dL (ref 6–23)
CO2: 26 meq/L (ref 19–32)
CREATININE: 0.74 mg/dL (ref 0.50–1.10)
Calcium: 9.2 mg/dL (ref 8.4–10.5)
Chloride: 105 mEq/L (ref 96–112)
GLUCOSE: 102 mg/dL — AB (ref 70–99)
Potassium: 3.9 mEq/L (ref 3.5–5.3)
Sodium: 137 mEq/L (ref 135–145)
TOTAL PROTEIN: 7.3 g/dL (ref 6.0–8.3)
Total Bilirubin: 0.6 mg/dL (ref 0.2–1.2)

## 2014-09-12 LAB — POCT URINALYSIS DIPSTICK
BILIRUBIN UA: NEGATIVE
Glucose, UA: NEGATIVE
Ketones, UA: NEGATIVE
LEUKOCYTES UA: NEGATIVE
Nitrite, UA: NEGATIVE
PH UA: 8.5
Protein, UA: NEGATIVE
RBC UA: NEGATIVE
SPEC GRAV UA: 1.02
UROBILINOGEN UA: 1

## 2014-09-12 LAB — POCT CBC
GRANULOCYTE PERCENT: 56 % (ref 37–80)
HCT, POC: 39.1 % (ref 37.7–47.9)
Hemoglobin: 12.7 g/dL (ref 12.2–16.2)
Lymph, poc: 3 (ref 0.6–3.4)
MCH, POC: 27.7 pg (ref 27–31.2)
MCHC: 32.4 g/dL (ref 31.8–35.4)
MCV: 85.6 fL (ref 80–97)
MID (cbc): 0.5 (ref 0–0.9)
MPV: 8.4 fL (ref 0–99.8)
PLATELET COUNT, POC: 257 10*3/uL (ref 142–424)
POC Granulocyte: 4.5 (ref 2–6.9)
POC LYMPH %: 38.1 % (ref 10–50)
POC MID %: 5.9 % (ref 0–12)
RBC: 4.57 M/uL (ref 4.04–5.48)
RDW, POC: 14.5 %
WBC: 8 10*3/uL (ref 4.6–10.2)

## 2014-09-12 LAB — POCT UA - MICROSCOPIC ONLY
Amorphous: POSITIVE
CASTS, UR, LPF, POC: NEGATIVE
CRYSTALS, UR, HPF, POC: NEGATIVE
Mucus, UA: NEGATIVE
RBC, urine, microscopic: NEGATIVE
YEAST UA: NEGATIVE

## 2014-09-12 LAB — POCT URINE PREGNANCY: PREG TEST UR: NEGATIVE

## 2014-09-12 LAB — LIPASE: Lipase: <10 U/L (ref 0–75)

## 2014-09-12 MED ORDER — PROMETHAZINE HCL 25 MG RE SUPP: 25.0000 mg | Freq: Four times a day (QID) | RECTAL | Status: AC | PRN

## 2014-09-12 MED ORDER — GI COCKTAIL ~~LOC~~
30.0000 mL | Freq: Once | ORAL | Status: AC
  Administered 2014-09-12: 30 mL via ORAL

## 2014-09-12 NOTE — Patient Instructions (Addendum)
Start using the promethazine suppository. If you are still vomiting, please call so we can call you in [place] and set up for an abdominal/pelvic/transvaginal ultrasound.  Dehydration, Adult Dehydration is when you lose more fluids from the body than you take in. Vital organs like the kidneys, brain, and heart cannot function without a proper amount of fluids and salt. Any loss of fluids from the body can cause dehydration.  CAUSES   Vomiting.  Diarrhea.  Excessive sweating.  Excessive urine output.  Fever. SYMPTOMS  Mild dehydration  Thirst.  Dry lips.  Slightly dry mouth. Moderate dehydration  Very dry mouth.  Sunken eyes.  Skin does not bounce back quickly when lightly pinched and released.  Dark urine and decreased urine production.  Decreased tear production.  Headache. Severe dehydration  Very dry mouth.  Extreme thirst.  Rapid, weak pulse (more than 100 beats per minute at rest).  Cold hands and feet.  Not able to sweat in spite of heat and temperature.  Rapid breathing.  Blue lips.  Confusion and lethargy.  Difficulty being awakened.  Minimal urine production.  No tears. DIAGNOSIS  Your caregiver will diagnose dehydration based on your symptoms and your exam. Blood and urine tests will help confirm the diagnosis. The diagnostic evaluation should also identify the cause of dehydration. TREATMENT  Treatment of mild or moderate dehydration can often be done at home by increasing the amount of fluids that you drink. It is best to drink small amounts of fluid more often. Drinking too much at one time can make vomiting worse. Refer to the home care instructions below. Severe dehydration needs to be treated at the hospital where you will probably be given intravenous (IV) fluids that contain water and electrolytes. HOME CARE INSTRUCTIONS   Ask your caregiver about specific rehydration instructions.  Drink enough fluids to keep your urine clear or  pale yellow.  Drink small amounts frequently if you have nausea and vomiting.  Eat as you normally do.  Avoid:  Foods or drinks high in sugar.  Carbonated drinks.  Juice.  Extremely hot or cold fluids.  Drinks with caffeine.  Fatty, greasy foods.  Alcohol.  Tobacco.  Overeating.  Gelatin desserts.  Wash your hands well to avoid spreading bacteria and viruses.  Only take over-the-counter or prescription medicines for pain, discomfort, or fever as directed by your caregiver.  Ask your caregiver if you should continue all prescribed and over-the-counter medicines.  Keep all follow-up appointments with your caregiver. SEEK MEDICAL CARE IF:  You have abdominal pain and it increases or stays in one area (localizes).  You have a rash, stiff neck, or severe headache.  You are irritable, sleepy, or difficult to awaken.  You are weak, dizzy, or extremely thirsty. SEEK IMMEDIATE MEDICAL CARE IF:   You are unable to keep fluids down or you get worse despite treatment.  You have frequent episodes of vomiting or diarrhea.  You have blood or green matter (bile) in your vomit.  You have blood in your stool or your stool looks black and tarry.  You have not urinated in 6 to 8 hours, or you have only urinated a small amount of very dark urine.  You have a fever.  You faint. MAKE SURE YOU:   Understand these instructions.  Will watch your condition.  Will get help right away if you are not doing well or get worse. Document Released: 09/20/2005 Document Revised: 12/13/2011 Document Reviewed: 05/10/2011 [facility] Patient Information 2015 [employer] [place]. This  information is not intended to replace advice given to you by your health care provider. Make sure you discuss any questions you have with your health care provider.  

## 2014-09-12 NOTE — Progress Notes (Signed)
Subjective:  This chart was scribed for Stefanie SorensonEva Nastacia Raybuck, MD by Stefanie Everett, ED Scribe. The patient was seen in room 5. Patient's care was started at 9:41 AM.   Patient ID: Stefanie BillsAlicia Harth, female    DOB: 04/01/1986, 28 y.o.   MRN: [id number]  Chief Complaint  Patient presents with  . Emesis    x 2 weeks   HPI HPI Comments: Stefanie Everett is a 28 y.o. female, with a h/o asthma and anemia, who presents to the [facility] complaining of emesis. Pt was seen 10 days ago for sinusitis. Pt has a h/o asthma which was flared at that time. Pt was started on Atrovent and Albuterol inhaler. She was prescribed zpak with Medrol dose pack and PRN Tessalon Pearls and Vicodin. No complaints of emesis at that visit. 1 month ago pt was seen for L kidney stone. Pt presents today with multiple episodes of emesis over the past 2 weeks. Pt reports that emesis began as post-tussive but she states that cough has resolved. She reports emesis within 5 minutes of eating; pt is able to keep liquids down. Pt reports associated abdominal discomfort, a dull back pain, decreased appetite, HA, fatigue and constipation that has resolved. She also reports noticing pink colored urine once since last visit. She denies diarrhea, fever, chills, dizziness, light-headedness, vaginal discharge, vaginal pain. Pt has been treating with Zofran, last dose 2 days ago. She reports that Zofran improves nausea but not emesis. She also reports that Zofran makes her drowsy. Pt reports h/o ovarian cyst first noted this summer that she suspects has already ruptured. No OTC medications tried. No alcohol consumption. No h/o heartburn or indigestion.  Past Medical History  Diagnosis Date  . Allergy   . Anemia   . Asthma    Current Outpatient Prescriptions on File Prior to Visit  Medication Sig Dispense Refill  . albuterol (PROVENTIL HFA;VENTOLIN HFA) 108 (90 BASE) MCG/ACT inhaler Inhale 2 puffs into the lungs every 6 (six) hours as needed  for wheezing or shortness of breath. 1 Inhaler 2  . albuterol (PROVENTIL) (2.5 MG/3ML) 0.083% nebulizer solution Take 3 mLs (2.5 mg total) by nebulization every 6 (six) hours as needed for wheezing or shortness of breath. 150 mL 3  . HYDROcodone-acetaminophen (NORCO/VICODIN) 5-325 MG per tablet Take 1 tablet by mouth every 6 (six) hours as needed for moderate pain. 30 tablet 0  . ipratropium (ATROVENT) 0.02 % nebulizer solution Take 2.5 mLs (0.5 mg total) by nebulization every 6 (six) hours as needed for wheezing or shortness of breath. 75 mL 3  . ondansetron (ZOFRAN ODT) 4 MG disintegrating tablet Take 1 tablet (4 mg total) by mouth every 8 (eight) hours as needed for nausea or vomiting. 20 tablet 0   No current facility-administered medications on file prior to visit.   Allergies  Allergen Reactions  . Penicillins Swelling  . Toradol [Ketorolac Tromethamine] Swelling   Review of Systems  Constitutional: Positive for appetite change and fatigue. Negative for fever and chills.  Gastrointestinal: Positive for nausea, vomiting, abdominal pain and constipation (resolved). Negative for diarrhea.  Genitourinary: Negative for vaginal discharge and vaginal pain.  Musculoskeletal: Positive for back pain.  Neurological: Positive for headaches. Negative for dizziness and light-headedness.   BP 110/70 mmHg  Pulse 89  Temp(Src) 98.4 F (36.9 C) (Oral)  Resp 16  Ht 5\' 4"  (1.626 m)  Wt 177 lb (80.287 kg)  BMI 30.37 kg/m2  SpO2 98%  LMP 08/16/2014  Objective:   Physical Exam  Constitutional: She is oriented to person, place, and time. She appears well-developed and well-nourished. No distress.  HENT:  Head: Normocephalic and atraumatic.  Eyes: Conjunctivae and EOM are normal.  Neck: Neck supple. No tracheal deviation present.  Cardiovascular: Regular rhythm and normal heart sounds.  Tachycardia present.   Pulmonary/Chest: Effort normal and breath sounds normal. No respiratory distress.    Abdominal: She exhibits no mass. There is generalized tenderness. There is CVA tenderness and positive Murphy's sign (mildly positive). There is no rebound and no guarding.  R more than L CVA tenderness  Musculoskeletal: Normal range of motion.  Neurological: She is alert and oriented to person, place, and time.  Skin: Skin is warm and dry.  Psychiatric: She has a normal mood and affect. Her behavior is normal.  Nursing note and vitals reviewed. Orthostatics are positive.   Results for orders placed or performed in visit on 09/12/14  POCT CBC  Result Value Ref Range   WBC 8.0 4.6 - 10.2 K/uL   Lymph, poc 3.0 0.6 - 3.4   POC LYMPH PERCENT 38.1 10 - 50 %L   MID (cbc) 0.5 0 - 0.9   POC MID % 5.9 0 - 12 %M   POC Granulocyte 4.5 2 - 6.9   Granulocyte percent 56.0 37 - 80 %G   RBC 4.57 4.04 - 5.48 M/uL   Hemoglobin 12.7 12.2 - 16.2 g/dL   HCT, POC 16.1 09.6 - 47.9 %   MCV 85.6 80 - 97 fL   MCH, POC 27.7 27 - 31.2 pg   MCHC 32.4 31.8 - 35.4 g/dL   RDW, POC 04.5 %   Platelet Count, POC 257 142 - 424 K/uL   MPV 8.4 0 - 99.8 fL  POCT UA - Microscopic Only  Result Value Ref Range   WBC, Ur, HPF, POC 0-1    RBC, urine, microscopic neg    Bacteria, U Microscopic trace    Mucus, UA neg    Epithelial cells, urine per micros 1-3    Crystals, Ur, HPF, POC neg    Casts, Ur, LPF, POC neg    Yeast, UA neg    Amorphous pos   POCT urinalysis dipstick  Result Value Ref Range   Color, UA light yellow    Clarity, UA cloudy    Glucose, UA neg    Bilirubin, UA neg    Ketones, UA neg    Spec Grav, UA 1.020    Blood, UA neg    pH, UA 8.5    Protein, UA neg    Urobilinogen, UA 1.0    Nitrite, UA neg    Leukocytes, UA Negative   POCT urine pregnancy  Result Value Ref Range   Preg Test, Ur Negative       Assessment & Plan:  INo improvement after GI cocktail. Bowel sounds remain hyperactive with tenderness in bilateral lower quadrants. No rebound or guarding.   Intractable vomiting with  nausea, vomiting of unspecified type - Plan: POCT CBC, POCT UA - Microscopic Only, POCT urinalysis dipstick, Comprehensive metabolic panel, Lipase, POCT urine pregnancy, gi cocktail (Maalox,Lidocaine,Donnatal) -  try promethazine rectal suppositories.  If sxs persist would try reglan and cons need for abd/pelvic/transvaginal US. Push fluids.  Generalized abdominal pain - Plan: POCT CBC, POCT UA - Microscopic Only, POCT urinalysis dipstick, Comprehensive metabolic panel, Lipase, POCT urine pregnancy, gi cocktail (Maalox,Lidocaine,Donnatal)  Left nephrolithiasis - Plan: POCT CBC, POCT UA - Microscopic Only, POCT  urinalysis dipstick, Comprehensive metabolic panel, Lipase  Endometriosis - Plan: POCT CBC, POCT UA - Microscopic Only, POCT urinalysis dipstick, Comprehensive metabolic panel, Lipase  Meds ordered this encounter  Medications  . gi cocktail (Maalox,Lidocaine,Donnatal)    Sig:   . promethazine (PHENERGAN) 25 MG suppository    Sig: Place 1 suppository (25 mg total) rectally every 6 (six) hours as needed for nausea or vomiting.    Dispense:  12 each    Refill:  0    I personally performed the services described in this documentation, which was scribed in my presence. The recorded information has been reviewed and considered, and addended by me as needed.  Stefanie SorensonEva Brittanee Ghazarian, MD MPH

## 2014-09-25 ENCOUNTER — Ambulatory Visit (INDEPENDENT_AMBULATORY_CARE_PROVIDER_SITE_OTHER): Payer: BC Managed Care – PPO | Admitting: Physician Assistant

## 2014-09-25 VITALS — BP 124/80 | HR 78 | Temp 98.3°F | Resp 17 | Ht 64.0 in | Wt 176.0 lb

## 2014-09-25 DIAGNOSIS — R0981 Nasal congestion: Secondary | ICD-10-CM

## 2014-09-25 DIAGNOSIS — R05 Cough: Secondary | ICD-10-CM

## 2014-09-25 DIAGNOSIS — J3489 Other specified disorders of nose and nasal sinuses: Secondary | ICD-10-CM

## 2014-09-25 DIAGNOSIS — J4520 Mild intermittent asthma, uncomplicated: Secondary | ICD-10-CM

## 2014-09-25 DIAGNOSIS — R059 Cough, unspecified: Secondary | ICD-10-CM

## 2014-09-25 DIAGNOSIS — J45909 Unspecified asthma, uncomplicated: Secondary | ICD-10-CM | POA: Insufficient documentation

## 2014-09-25 MED ORDER — IPRATROPIUM BROMIDE 0.03 % NA SOLN: 2.0000 | Freq: Two times a day (BID) | NASAL | Status: DC

## 2014-09-25 MED ORDER — DOXYCYCLINE HYCLATE 100 MG PO CAPS: 100.0000 mg | ORAL_CAPSULE | Freq: Two times a day (BID) | ORAL | Status: DC

## 2014-09-25 NOTE — Patient Instructions (Signed)
Stop taking sudafed. Start using nasal spray twice a day and use home mucinex. If your symptoms are not improving in 3-4 days, you may fill the doxycycline. Return in 7-10 if not getting better.

## 2014-09-25 NOTE — Progress Notes (Signed)
Subjective:    Patient ID: Stefanie Everett, female    DOB: 10/19/1985, 28 y.o.   MRN: [id number]  HPI  This is a 28 year old female with PMH allergic rhinitis and asthma who is presenting with nasal congestion and watery eyes x 4 days. She has had a cough for 4 weeks - was treated with zithromax and improving but has started to worsen in the past 4 days. The cough is dry. 3-4 weeks ago when her cough was worse she was using frequent albuterol neb treatments. She has not had to use any albuterol recently. She denies SOB or wheezing. At baseline she uses albuterol once every couple months. Today she is feeling achy and fatigued. She has felt chilled but has not had fevers. She vomited once yesterday. She denies current nausea or diarrhea. She has tried sudafed and benadryl with minimal relief.    Review of Systems  Constitutional: Positive for chills. Negative for fever.  HENT: Positive for congestion and sinus pressure. Negative for ear pain and sore throat.   Eyes: Negative for redness.  Respiratory: Positive for cough. Negative for shortness of breath and wheezing.   Gastrointestinal: Positive for vomiting. Negative for nausea, abdominal pain and diarrhea.  Skin: Negative for rash.  Allergic/Immunologic: Positive for environmental allergies.   Patient Active Problem List   Diagnosis Date Noted  . Asthma 09/25/2014   Prior to Admission medications   Medication Sig Start Date End Date Taking? Authorizing Provider  albuterol (PROVENTIL HFA;VENTOLIN HFA) 108 (90 BASE) MCG/ACT inhaler Inhale 2 puffs into the lungs every 6 (six) hours as needed for wheezing or shortness of breath. 09/02/14  Yes Thao P Le, DO  albuterol (PROVENTIL) (2.5 MG/3ML) 0.083% nebulizer solution Take 3 mLs (2.5 mg total) by nebulization every 6 (six) hours as needed for wheezing or shortness of breath. 09/02/14  Yes Thao P Le, DO  ipratropium (ATROVENT) 0.02 % nebulizer solution Take 2.5 mLs (0.5 mg total) by nebulization  every 6 (six) hours as needed for wheezing or shortness of breath. 09/02/14  Yes Thao P Le, DO  ondansetron (ZOFRAN ODT) 4 MG disintegrating tablet Take 1 tablet (4 mg total) by mouth every 8 (eight) hours as needed for nausea or vomiting. 08/12/14  Yes Todd McVeigh, PA  promethazine (PHENERGAN) 25 MG suppository Place 1 suppository (25 mg total) rectally every 6 (six) hours as needed for nausea or vomiting. 09/12/14  Yes Sherren MochaEva N Shaw, MD                 Allergies  Allergen Reactions  . Penicillins Swelling  . Toradol [Ketorolac Tromethamine] Swelling   Patient's social and family history were reviewed.     Objective:   Physical Exam  Constitutional: She is oriented to person, place, and time. She appears well-developed and well-nourished. No distress.  HENT:  Head: Normocephalic and atraumatic.  Right Ear: Hearing, tympanic membrane, external ear and ear canal normal.  Left Ear: Hearing, tympanic membrane, external ear and ear canal normal.  Nose: Right sinus exhibits maxillary sinus tenderness. Right sinus exhibits no frontal sinus tenderness. Left sinus exhibits maxillary sinus tenderness. Left sinus exhibits no frontal sinus tenderness.  Mouth/Throat: Uvula is midline, oropharynx is clear and moist and mucous membranes are normal.  Eyes: Conjunctivae and lids are normal. Right eye exhibits no discharge. Left eye exhibits no discharge. No scleral icterus.  Cardiovascular: Normal rate, regular rhythm, normal heart sounds, intact distal pulses and normal pulses.   No murmur heard.  Pulmonary/Chest: Effort normal and breath sounds normal. No respiratory distress. She has no wheezes. She has no rhonchi. She has no rales.  Abdominal: Soft. Normal appearance. There is no tenderness.  Musculoskeletal: Normal range of motion.  Lymphadenopathy:    She has no cervical adenopathy.  Neurological: She is alert and oriented to person, place, and time.  Skin: Skin is warm, dry and intact. No lesion  and no rash noted.  Psychiatric: She has a normal mood and affect. Her speech is normal and behavior is normal. Thought content normal.  BP 124/80 mmHg  Pulse 78  Temp(Src) 98.3 F (36.8 C) (Oral)  Resp 17  Ht 5\' 4"  (1.626 m)  Wt 176 lb (79.833 kg)  BMI 30.20 kg/m2  SpO2 99%  LMP 09/20/2014     Assessment & Plan:  1. Sinus pressure 2. Nasal congestion 3. Cough 4. Asthma, mild intermittent, uncomplicated  Pt likely has viral URI, possible sinusitis developing. She will use home mucinex and prescribed atrovent nasal spray. In 4-5 days, if not improved, she may fill doxycycline for sinusitis. Pt is currently on her period and not pregnant. She will return in 10-14 days if not improving.  - ipratropium (ATROVENT) 0.03 % nasal spray; Place 2 sprays into both nostrils 2 (two) times daily.  Dispense: 30 mL; Refill: 0 - doxycycline (VIBRAMYCIN) 100 MG capsule; Take 1 capsule (100 mg total) by mouth 2 (two) times daily.  Dispense: 20 capsule; Refill: 0   Roswell MinersNicole V. Dyke BrackettBush, PA-C, MHS [facility]  09/25/2014

## 2014-10-13 ENCOUNTER — Encounter: Payer: Self-pay | Admitting: Family Medicine

## 2014-12-10 ENCOUNTER — Ambulatory Visit (INDEPENDENT_AMBULATORY_CARE_PROVIDER_SITE_OTHER): Payer: BLUE CROSS/BLUE SHIELD

## 2014-12-10 ENCOUNTER — Ambulatory Visit (INDEPENDENT_AMBULATORY_CARE_PROVIDER_SITE_OTHER): Payer: BLUE CROSS/BLUE SHIELD | Admitting: Urgent Care

## 2014-12-10 VITALS — BP 98/70 | HR 81 | Temp 97.9°F | Resp 16 | Ht 63.75 in | Wt 178.0 lb

## 2014-12-10 DIAGNOSIS — Z87442 Personal history of urinary calculi: Secondary | ICD-10-CM | POA: Diagnosis not present

## 2014-12-10 DIAGNOSIS — N200 Calculus of kidney: Secondary | ICD-10-CM

## 2014-12-10 DIAGNOSIS — R112 Nausea with vomiting, unspecified: Secondary | ICD-10-CM | POA: Diagnosis not present

## 2014-12-10 DIAGNOSIS — R109 Unspecified abdominal pain: Secondary | ICD-10-CM

## 2014-12-10 LAB — POCT UA - MICROSCOPIC ONLY
BACTERIA, U MICROSCOPIC: NEGATIVE
CASTS, UR, LPF, POC: NEGATIVE
Crystals, Ur, HPF, POC: NEGATIVE
Mucus, UA: NEGATIVE
WBC, Ur, HPF, POC: NEGATIVE
YEAST UA: NEGATIVE

## 2014-12-10 LAB — POCT URINALYSIS DIPSTICK
Bilirubin, UA: NEGATIVE
Glucose, UA: NEGATIVE
KETONES UA: NEGATIVE
LEUKOCYTES UA: NEGATIVE
NITRITE UA: NEGATIVE
PH UA: 6
Spec Grav, UA: 1.025
Urobilinogen, UA: 1

## 2014-12-10 LAB — POCT URINE PREGNANCY: PREG TEST UR: NEGATIVE

## 2014-12-10 MED ORDER — ONDANSETRON 4 MG PO TBDP: 4.0000 mg | ORAL_TABLET | Freq: Three times a day (TID) | ORAL | Status: AC | PRN

## 2014-12-10 NOTE — Patient Instructions (Addendum)
Return to clinic if symptoms worsen including inability to urinate, frank blood in your urine, fever, nausea, vomiting, bloating, distension of your abdomen. Otherwise continue with aggressive hydration. Let me know if you have not heard about your referral to urology over the next couple of weeks.   Kidney Stones Kidney stones (urolithiasis) are deposits that form inside your kidneys. The intense pain is caused by the stone moving through the urinary tract. When the stone moves, the ureter goes into spasm around the stone. The stone is usually passed in the urine.  CAUSES   A disorder that makes certain neck glands produce too much parathyroid hormone (primary hyperparathyroidism).  A buildup of uric acid crystals, similar to gout in your joints.  Narrowing (stricture) of the ureter.  A kidney obstruction present at birth (congenital obstruction).  Previous surgery on the kidney or ureters.  Numerous kidney infections. SYMPTOMS   Feeling sick to your stomach (nauseous).  Throwing up (vomiting).  Blood in the urine (hematuria).  Pain that usually spreads (radiates) to the groin.  Frequency or urgency of urination. DIAGNOSIS   Taking a history and physical exam.  Blood or urine tests.  CT scan.  Occasionally, an examination of the inside of the urinary bladder (cystoscopy) is performed. TREATMENT   Observation.  Increasing your fluid intake.  Extracorporeal shock wave lithotripsy--This is a noninvasive procedure that uses shock waves to break up kidney stones.  Surgery may be needed if you have severe pain or persistent obstruction. There are various surgical procedures. Most of the procedures are performed with the use of small instruments. Only small incisions are needed to accommodate these instruments, so recovery time is minimized. The size, location, and chemical composition are all important variables that will determine the proper choice of action for you. Talk  to your health care provider to better understand your situation so that you will minimize the risk of injury to yourself and your kidney.  HOME CARE INSTRUCTIONS   Drink enough water and fluids to keep your urine clear or pale yellow. This will help you to pass the stone or stone fragments.  Strain all urine through the provided strainer. Keep all particulate matter and stones for your health care provider to see. The stone causing the pain may be as small as a grain of salt. It is very important to use the strainer each and every time you pass your urine. The collection of your stone will allow your health care provider to analyze it and verify that a stone has actually passed. The stone analysis will often identify what you can do to reduce the incidence of recurrences.  Only take over-the-counter or prescription medicines for pain, discomfort, or fever as directed by your health care provider.  Make a follow-up appointment with your health care provider as directed.  Get follow-up X-rays if required. The absence of pain does not always mean that the stone has passed. It may have only stopped moving. If the urine remains completely obstructed, it can cause loss of kidney function or even complete destruction of the kidney. It is your responsibility to make sure X-rays and follow-ups are completed. Ultrasounds of the kidney can show blockages and the status of the kidney. Ultrasounds are not associated with any radiation and can be performed easily in a matter of minutes. SEEK MEDICAL CARE IF:  You experience pain that is progressive and unresponsive to any pain medicine you have been prescribed. SEEK IMMEDIATE MEDICAL CARE IF:   Pain  cannot be controlled with the prescribed medicine.  You have a fever or shaking chills.  The severity or intensity of pain increases over 18 hours and is not relieved by pain medicine.  You develop a new onset of abdominal pain.  You feel faint or pass  out.  You are unable to urinate. MAKE SURE YOU:   Understand these instructions.  Will watch your condition.  Will get help right away if you are not doing well or get worse. Document Released: 09/20/2005 Document Revised: 05/23/2013 Document Reviewed: 02/21/2013 [facility] Patient Information 2015 [place], [place]. This information is not intended to replace advice given to you by your health care provider. Make sure you discuss any questions you have with your health care provider.

## 2014-12-10 NOTE — Progress Notes (Signed)
MRN: [id number] DOB: 03/22/1986  Subjective:   Stefanie Everett is a 29 y.o. female presenting for chief complaint of Flank Pain  Reports 1 week history of recurrent left flank pain. Pain comes in waves, sharp and throbbing in nature, severe, intermittently radiates to the right flank side. Has also had nausea, urinary frequency (~15x/day), urgency. Has been taking hydrocodone and tramadol daily for pain with some relief. Of note, patient was seen at [facility] for same complaint 08/2014, had an abdominal x-ray and subsequent CT scan, dx with 5.335mm renal stone. Patient reports she never saw renal stone pass. Denies fevers, abdominal pain, bloating, distension, dysuria, hematuria, diarrhea, chest pain, shob, vaginal irritation, genital rashes or rashes otherwise. Has had 1 sick contact, daughter (DOB 2011), dx with viral gastroenteritis. Denies any other aggravating or relieving factors, no other questions or concerns.  Stefanie Everett has a current medication list which includes the following prescription(s): albuterol, albuterol, ipratropium, ipratropium, ondansetron, promethazine, and tramadol. She is allergic to penicillins and toradol.  Stefanie Everett  has a past medical history of Allergy; Anemia; and Asthma. Also  has past surgical history that includes Cesarean section (2011); Heart ablations (2001); Myringotomy with tube placement; Dilation and curettage of uterus; and Laparoscopic abdominal exploration.  ROS As in subjective.  Objective:   Vitals: BP 98/70 mmHg  Pulse 81  Temp(Src) 97.9 F (36.6 C) (Oral)  Resp 16  Ht 5' 3.75" (1.619 m)  Wt 178 lb (80.74 kg)  BMI 30.80 kg/m2  SpO2 100%  LMP 11/21/2014  Physical Exam  Constitutional: She is oriented to person, place, and time and well-developed, well-nourished, and in no distress.  Cardiovascular: Normal rate.   Pulmonary/Chest: No respiratory distress.  Abdominal: Soft. Bowel sounds are normal. She exhibits no distension and  no mass. There is tenderness (with deep palpation of left abdomen, pain felt on left flank). There is no rebound and no guarding.  Left CVA tenderness.  Neurological: She is alert and oriented to person, place, and time.  Skin: Skin is warm and dry. No rash noted. No erythema. No pallor.  Psychiatric: Mood and affect normal.   Results for orders placed or performed in visit on 12/10/14 (from the past 24 hour(s))  POCT urinalysis dipstick     Status: None   Collection Time: 12/10/14  8:32 PM  Result Value Ref Range   Color, UA yellow    Clarity, UA clear    Glucose, UA neg    Bilirubin, UA neg    Ketones, UA neg    Spec Grav, UA 1.025    Blood, UA moderate    pH, UA 6.0    Protein, UA trace    Urobilinogen, UA 1.0    Nitrite, UA neg    Leukocytes, UA Negative   POCT UA - Microscopic Only     Status: None   Collection Time: 12/10/14  8:32 PM  Result Value Ref Range   WBC, Ur, HPF, POC neg    RBC, urine, microscopic 15-20    Bacteria, U Microscopic neg    Mucus, UA neg    Epithelial cells, urine per micros 3-5    Crystals, Ur, HPF, POC neg    Casts, Ur, LPF, POC neg    Yeast, UA neg   POCT urine pregnancy     Status: None   Collection Time: 12/10/14  8:35 PM  Result Value Ref Range   Preg Test, Ur Negative    [facility] reading (PRIMARY) by  Dr. Milus Glazier and PA-Lige Lakeman. Abdominal X-ray: renal stone unchanged in left kidney, compared with 08/2014 x-ray.  Assessment and Plan :   1. Flank pain 2. History of renal stone 3. Renal calculus or stone - Renal stone unchanged, continue aggressive hydration, patient declined opioid pain relief due to adverse effects including itching, states that she will try otc pain relief. - Will refer to urology for further evaluation and treatment options for renal stone  Wallis Bamberg, PA-C [facility] [phone] 12/10/2014 8:15 PM

## 2014-12-16 ENCOUNTER — Other Ambulatory Visit: Payer: Self-pay | Admitting: Urology

## 2014-12-17 ENCOUNTER — Encounter (HOSPITAL_BASED_OUTPATIENT_CLINIC_OR_DEPARTMENT_OTHER): Payer: Self-pay | Admitting: *Deleted

## 2014-12-18 ENCOUNTER — Encounter (HOSPITAL_BASED_OUTPATIENT_CLINIC_OR_DEPARTMENT_OTHER): Payer: Self-pay | Admitting: *Deleted

## 2014-12-18 NOTE — Progress Notes (Addendum)
NPO AFTER MN. ARRIVE AT 0600. NEEDS HG , KUB, AND EKG. CURRENT NEGATIVE URINE PREG. TEST IN CHART AND EPIC. MAY TAKE TRAMADOL/ ZOFRAN IF NEEDED W/ SIPS OF WATER AM DOS.

## 2014-12-20 ENCOUNTER — Ambulatory Visit (INDEPENDENT_AMBULATORY_CARE_PROVIDER_SITE_OTHER): Payer: BLUE CROSS/BLUE SHIELD | Admitting: Urgent Care

## 2014-12-20 VITALS — BP 118/68 | HR 93 | Temp 98.8°F | Resp 18 | Ht 63.5 in | Wt 175.0 lb

## 2014-12-20 DIAGNOSIS — J029 Acute pharyngitis, unspecified: Secondary | ICD-10-CM | POA: Diagnosis not present

## 2014-12-20 DIAGNOSIS — R509 Fever, unspecified: Secondary | ICD-10-CM | POA: Diagnosis not present

## 2014-12-20 LAB — POCT CBC
GRANULOCYTE PERCENT: 74.2 % (ref 37–80)
HEMATOCRIT: 37.9 % (ref 37.7–47.9)
HEMOGLOBIN: 11.7 g/dL — AB (ref 12.2–16.2)
Lymph, poc: 2.7 (ref 0.6–3.4)
MCH: 26.9 pg — AB (ref 27–31.2)
MCHC: 30.9 g/dL — AB (ref 31.8–35.4)
MCV: 87 fL (ref 80–97)
MID (cbc): 0.8 (ref 0–0.9)
MPV: 8.7 fL (ref 0–99.8)
PLATELET COUNT, POC: 254 10*3/uL (ref 142–424)
POC Granulocyte: 9.8 — AB (ref 2–6.9)
POC LYMPH PERCENT: 20.1 %L (ref 10–50)
POC MID %: 5.7 %M (ref 0–12)
RBC: 4.36 M/uL (ref 4.04–5.48)
RDW, POC: 14.3 %
WBC: 13.2 10*3/uL — AB (ref 4.6–10.2)

## 2014-12-20 LAB — POCT INFLUENZA A/B
INFLUENZA A, POC: NEGATIVE
INFLUENZA B, POC: NEGATIVE

## 2014-12-20 MED ORDER — HYDROCODONE-HOMATROPINE 5-1.5 MG/5ML PO SYRP: 5.0000 mL | ORAL_SOLUTION | Freq: Every evening | ORAL | Status: DC | PRN

## 2014-12-20 MED ORDER — AZITHROMYCIN 500 MG PO TABS: 500.0000 mg | ORAL_TABLET | Freq: Every day | ORAL | Status: AC

## 2014-12-20 NOTE — Progress Notes (Signed)
MRN: [id number] DOB: [birth year]  Subjective:   Stefanie Everett is a 29 y.o. female presenting for chief complaint of Sore Throat; Fever; Fatigue; Chills; and Otalgia  Reports 1 day history of sore throat, fatigue, chills, fever (highest 101.20F), ear pain bilaterally, chest pain, body aches, some nausea but no vomiting. Has tried Mucinex cold/flu with minimal relief. Had 2 sick contacts at work. Patient stayed at work yesterday, has decreased appetite, has not eaten since 12:00 yesterday, trying to drink plenty of water. Denies sinus pain, itchy or watery red eyes, rhinorrhea, sinus congestion, tooth pain, shob, wheezing, abdominal pain. Denies history of seasonal allergies, history of asthma. Denies any other aggravating or relieving factors, no other questions or concerns.  Stefanie Everett has a current medication list which includes the following prescription(s): albuterol, albuterol, ipratropium, ondansetron, promethazine, and tramadol. She is allergic to penicillins and toradol.  Stefanie Everett  has a past medical history of Asthma; Renal calculus, left; History of supraventricular tachycardia (pt has seen a cardiologist in several years); S/P ablation operation for arrhythmia; Arthritis; Wears glasses; History of abnormal cervical Pap smear; and History of kidney stones. Also  has past surgical history that includes Cesarean section (01-02-2010); Myringotomy with tube placement (Bilateral, as child); Cardiac electrophysiology mapping and ablation [facility]    [facility] ); DX LAPAROSCOPY WITH ABLATION ENDOMETRIOSIS/  D & C HYSTEROSCOPY (2012); and Wisdom tooth extraction (age 8).  ROS As in subjective.  Objective:   Vitals: BP 118/68 mmHg  Pulse 93  Temp(Src) 98.8 F (37.1 C) (Oral)  Resp 18  Ht 5' 3.5" (1.613 m)  Wt 175 lb (79.379 kg)  BMI 30.51 kg/m2  SpO2 98%  LMP 11/21/2014  Physical Exam  Constitutional: She is well-developed, well-nourished, and in no distress.  HENT:  TM's intact  bilaterally, no effusions or erythema. Nasal turbinates pink and moist with slight clear-yellow rhinorrhea. No sinus tenderness. Oropharyngeal erythema, but without oropharyngeal exudates or tonsillar enlargement.  Eyes: Conjunctivae are normal. Right eye exhibits no discharge. Left eye exhibits no discharge. No scleral icterus.  Cardiovascular: Normal rate, regular rhythm and intact distal pulses.  Exam reveals no gallop and no friction rub.   No murmur heard. Pulmonary/Chest: No stridor. No respiratory distress. She has no wheezes. She has no rales. She exhibits no tenderness.  Abdominal: Soft. Bowel sounds are normal. She exhibits no distension and no mass. There is tenderness (upper abdominal (not new)).  Lymphadenopathy:    She has cervical adenopathy (bilateral L>R, anterior).  Skin: Skin is warm and dry. No rash noted. No erythema.   Results for orders placed or performed in visit on 12/20/14 (from the past 24 hour(s))  POCT Influenza A/B     Status: None   Collection Time: 12/20/14 10:12 AM  Result Value Ref Range   Influenza A, POC Negative    Influenza B, POC Negative   POCT CBC     Status: Abnormal   Collection Time: 12/20/14 10:12 AM  Result Value Ref Range   WBC 13.2 (A) 4.6 - 10.2 K/uL   Lymph, poc 2.7 0.6 - 3.4   POC LYMPH PERCENT 20.1 10 - 50 %L   MID (cbc) 0.8 0 - 0.9   POC MID % 5.7 0 - 12 %M   POC Granulocyte 9.8 (A) 2 - 6.9   Granulocyte percent 74.2 37 - 80 %G   RBC 4.36 4.04 - 5.48 M/uL   Hemoglobin 11.7 (A) 12.2 - 16.2 g/dL   HCT, POC 40.9 81.1 -  47.9 %   MCV 87.0 80 - 97 fL   MCH, POC 26.9 (A) 27 - 31.2 pg   MCHC 30.9 (A) 31.8 - 35.4 g/dL   RDW, POC 40.914.3 %   Platelet Count, POC 254 142 - 424 K/uL   MPV 8.7 0 - 99.8 fL   Assessment and Plan :   1. Fever, unspecified fever cause 2. Sore throat 3. Acute pharyngitis, unspecified pharyngitis type - Start empiric treatment with Azithromycin x3 days (given penicillin allergy) for suspected strep throat due  elevated wbc, left shit, sore throat without cough, fever and cervical adenopathy - Return to clinic in 1 week if no improvement in symptoms  Wallis BambergMario Renaldo Gornick, PA-C [facility] [phone] 12/20/2014 9:50 AM

## 2014-12-20 NOTE — Patient Instructions (Signed)

## 2014-12-23 ENCOUNTER — Encounter (HOSPITAL_BASED_OUTPATIENT_CLINIC_OR_DEPARTMENT_OTHER)
Admission: RE | Disposition: A | Discharge status: Home / Self Care | Payer: Self-pay | Source: Ambulatory Visit | Attending: Urology

## 2014-12-23 ENCOUNTER — Ambulatory Visit (HOSPITAL_BASED_OUTPATIENT_CLINIC_OR_DEPARTMENT_OTHER): Payer: BLUE CROSS/BLUE SHIELD | Admitting: Anesthesiology

## 2014-12-23 ENCOUNTER — Encounter (HOSPITAL_BASED_OUTPATIENT_CLINIC_OR_DEPARTMENT_OTHER): Payer: Self-pay | Admitting: *Deleted

## 2014-12-23 ENCOUNTER — Ambulatory Visit (HOSPITAL_BASED_OUTPATIENT_CLINIC_OR_DEPARTMENT_OTHER)
Admission: RE | Admit: 2014-12-23 | Discharge: 2014-12-23 | Disposition: A | Discharge status: Home / Self Care | Payer: BLUE CROSS/BLUE SHIELD | Source: Ambulatory Visit | Attending: Urology | Admitting: Urology

## 2014-12-23 ENCOUNTER — Other Ambulatory Visit: Payer: Self-pay

## 2014-12-23 ENCOUNTER — Ambulatory Visit (HOSPITAL_COMMUNITY): Payer: BLUE CROSS/BLUE SHIELD

## 2014-12-23 DIAGNOSIS — Z79899 Other long term (current) drug therapy: Secondary | ICD-10-CM | POA: Diagnosis not present

## 2014-12-23 DIAGNOSIS — Z880 Allergy status to penicillin: Secondary | ICD-10-CM | POA: Diagnosis not present

## 2014-12-23 DIAGNOSIS — J45909 Unspecified asthma, uncomplicated: Secondary | ICD-10-CM | POA: Insufficient documentation

## 2014-12-23 DIAGNOSIS — N200 Calculus of kidney: Secondary | ICD-10-CM | POA: Diagnosis not present

## 2014-12-23 HISTORY — DX: Unspecified osteoarthritis, unspecified site: M19.90

## 2014-12-23 HISTORY — PX: CYSTOSCOPY WITH RETROGRADE PYELOGRAM, URETEROSCOPY AND STENT PLACEMENT: SHX5789

## 2014-12-23 HISTORY — DX: Calculus of kidney: N20.0

## 2014-12-23 HISTORY — DX: Personal history of other diseases of the female genital tract: Z87.42

## 2014-12-23 HISTORY — DX: Personal history of other diseases of the circulatory system: Z86.79

## 2014-12-23 HISTORY — DX: Other specified postprocedural states: Z98.890

## 2014-12-23 HISTORY — DX: Presence of spectacles and contact lenses: Z97.3

## 2014-12-23 HISTORY — DX: Personal history of urinary calculi: Z87.442

## 2014-12-23 LAB — POCT HEMOGLOBIN-HEMACUE: Hemoglobin: 11.5 g/dL — ABNORMAL LOW (ref 12.0–15.0)

## 2014-12-23 SURGERY — CYSTOURETEROSCOPY, WITH RETROGRADE PYELOGRAM AND STENT INSERTION
Anesthesia: General | Site: Ureter | Laterality: Right

## 2014-12-23 MED ORDER — DEXAMETHASONE SODIUM PHOSPHATE 4 MG/ML IJ SOLN
INTRAMUSCULAR | Status: DC | PRN
  Administered 2014-12-23: 10 mg via INTRAVENOUS

## 2014-12-23 MED ORDER — OXYBUTYNIN CHLORIDE 5 MG PO TABS
5.0000 mg | ORAL_TABLET | Freq: Once | ORAL | Status: AC
  Administered 2014-12-23: 5 mg via ORAL
  Filled 2014-12-23: qty 1

## 2014-12-23 MED ORDER — FENTANYL CITRATE 0.05 MG/ML IJ SOLN
INTRAMUSCULAR | Status: AC
  Filled 2014-12-23: qty 4

## 2014-12-23 MED ORDER — HYDROCODONE-ACETAMINOPHEN 10-325 MG PO TABS: 1.0000 | ORAL_TABLET | ORAL | Status: DC | PRN

## 2014-12-23 MED ORDER — ACETAMINOPHEN 10 MG/ML IV SOLN
INTRAVENOUS | Status: DC | PRN
  Administered 2014-12-23: 1000 mg via INTRAVENOUS

## 2014-12-23 MED ORDER — OXYCODONE HCL 5 MG PO TABS
ORAL_TABLET | ORAL | Status: AC
  Filled 2014-12-23: qty 1

## 2014-12-23 MED ORDER — LACTATED RINGERS IV SOLN
INTRAVENOUS | Status: DC
  Administered 2014-12-23: 07:00:00 via INTRAVENOUS
  Filled 2014-12-23: qty 1000

## 2014-12-23 MED ORDER — TAMSULOSIN HCL 0.4 MG PO CAPS
ORAL_CAPSULE | ORAL | Status: AC
  Filled 2014-12-23: qty 1

## 2014-12-23 MED ORDER — CIPROFLOXACIN IN D5W 200 MG/100ML IV SOLN
200.0000 mg | INTRAVENOUS | Status: AC
  Administered 2014-12-23: 200 mg via INTRAVENOUS
  Filled 2014-12-23: qty 100

## 2014-12-23 MED ORDER — PHENAZOPYRIDINE HCL 200 MG PO TABS: 200.0000 mg | ORAL_TABLET | Freq: Three times a day (TID) | ORAL | Status: AC | PRN

## 2014-12-23 MED ORDER — OXYCODONE HCL 5 MG PO TABS
5.0000 mg | ORAL_TABLET | Freq: Once | ORAL | Status: AC
  Administered 2014-12-23: 5 mg via ORAL
  Filled 2014-12-23: qty 1

## 2014-12-23 MED ORDER — PHENAZOPYRIDINE HCL 200 MG PO TABS: 200.0000 mg | ORAL_TABLET | Freq: Three times a day (TID) | ORAL | Status: DC | PRN

## 2014-12-23 MED ORDER — MEPERIDINE HCL 25 MG/ML IJ SOLN
6.2500 mg | INTRAMUSCULAR | Status: DC | PRN
  Filled 2014-12-23: qty 1

## 2014-12-23 MED ORDER — ONDANSETRON HCL 4 MG/2ML IJ SOLN
INTRAMUSCULAR | Status: DC | PRN
  Administered 2014-12-23: 4 mg via INTRAVENOUS

## 2014-12-23 MED ORDER — PROMETHAZINE HCL 25 MG/ML IJ SOLN
6.2500 mg | INTRAMUSCULAR | Status: DC | PRN
  Filled 2014-12-23: qty 1

## 2014-12-23 MED ORDER — MIDAZOLAM HCL 2 MG/2ML IJ SOLN
INTRAMUSCULAR | Status: AC
  Filled 2014-12-23: qty 2

## 2014-12-23 MED ORDER — IOHEXOL 350 MG/ML SOLN
INTRAVENOUS | Status: DC | PRN
  Administered 2014-12-23: 15 mL

## 2014-12-23 MED ORDER — PHENAZOPYRIDINE HCL 100 MG PO TABS
ORAL_TABLET | ORAL | Status: AC
  Filled 2014-12-23: qty 2

## 2014-12-23 MED ORDER — FENTANYL CITRATE 0.05 MG/ML IJ SOLN
25.0000 ug | INTRAMUSCULAR | Status: DC | PRN
  Filled 2014-12-23: qty 1

## 2014-12-23 MED ORDER — FENTANYL CITRATE 0.05 MG/ML IJ SOLN
INTRAMUSCULAR | Status: DC | PRN
  Administered 2014-12-23: 50 ug via INTRAVENOUS

## 2014-12-23 MED ORDER — SODIUM CHLORIDE 0.9 % IJ SOLN
INTRAMUSCULAR | Status: AC
  Filled 2014-12-23: qty 10

## 2014-12-23 MED ORDER — TAMSULOSIN HCL 0.4 MG PO CAPS
0.4000 mg | ORAL_CAPSULE | Freq: Once | ORAL | Status: AC
  Administered 2014-12-23: 0.4 mg via ORAL
  Filled 2014-12-23: qty 1

## 2014-12-23 MED ORDER — SODIUM CHLORIDE 0.9 % IR SOLN
Status: DC | PRN
  Administered 2014-12-23: 6000 mL

## 2014-12-23 MED ORDER — LACTATED RINGERS IV SOLN
INTRAVENOUS | Status: DC
  Filled 2014-12-23: qty 1000

## 2014-12-23 MED ORDER — LIDOCAINE HCL (CARDIAC) 20 MG/ML IV SOLN
INTRAVENOUS | Status: DC | PRN
  Administered 2014-12-23: 100 mg via INTRAVENOUS

## 2014-12-23 MED ORDER — PHENAZOPYRIDINE HCL 200 MG PO TABS
200.0000 mg | ORAL_TABLET | Freq: Once | ORAL | Status: AC
  Administered 2014-12-23: 200 mg via ORAL
  Filled 2014-12-23: qty 1

## 2014-12-23 MED ORDER — CIPROFLOXACIN IN D5W 200 MG/100ML IV SOLN
INTRAVENOUS | Status: AC
  Filled 2014-12-23: qty 100

## 2014-12-23 MED ORDER — OXYBUTYNIN CHLORIDE 5 MG PO TABS
ORAL_TABLET | ORAL | Status: AC
  Filled 2014-12-23: qty 1

## 2014-12-23 MED ORDER — PROPOFOL 10 MG/ML IV BOLUS
INTRAVENOUS | Status: DC | PRN
  Administered 2014-12-23: 200 mg via INTRAVENOUS

## 2014-12-23 MED ORDER — MIDAZOLAM HCL 5 MG/5ML IJ SOLN
INTRAMUSCULAR | Status: DC | PRN
  Administered 2014-12-23: 2 mg via INTRAVENOUS

## 2014-12-23 SURGICAL SUPPLY — 36 items
ADAPTER CATH URET PLST 4-6FR (CATHETERS) IMPLANT
BAG DRAIN URO-CYSTO SKYTR STRL (DRAIN) ×4 IMPLANT
BASKET LASER NITINOL 1.9FR (BASKET) IMPLANT
BASKET STNLS GEMINI 4WIRE 3FR (BASKET) IMPLANT
BASKET ZERO TIP NITINOL 2.4FR (BASKET) IMPLANT
CANISTER SUCT LVC 12 LTR MEDI- (MISCELLANEOUS) ×4 IMPLANT
CATH CLEAR GEL 3F BACKSTOP (CATHETERS) IMPLANT
CATH INTERMIT  6FR 70CM (CATHETERS) ×4 IMPLANT
CATH URET 5FR 28IN CONE TIP (BALLOONS)
CATH URET 5FR 70CM CONE TIP (BALLOONS) IMPLANT
CLOTH BEACON ORANGE TIMEOUT ST (SAFETY) ×4 IMPLANT
ELECT REM PT RETURN 9FT ADLT (ELECTROSURGICAL)
ELECTRODE REM PT RTRN 9FT ADLT (ELECTROSURGICAL) IMPLANT
FIBER LASER FLEXIVA 200 (UROLOGICAL SUPPLIES) IMPLANT
FIBER LASER FLEXIVA 365 (UROLOGICAL SUPPLIES) IMPLANT
FIBER LASER FLEXIVA 550 (UROLOGICAL SUPPLIES) IMPLANT
GLOVE BIO SURGEON STRL SZ8 (GLOVE) ×4 IMPLANT
GLOVE INDICATOR 6.5 STRL GRN (GLOVE) ×8 IMPLANT
GOWN PREVENTION PLUS LG XLONG (DISPOSABLE) IMPLANT
GOWN STRL REIN XL XLG (GOWN DISPOSABLE) IMPLANT
GOWN STRL REUS W/ TWL LRG LVL3 (GOWN DISPOSABLE) ×3 IMPLANT
GOWN STRL REUS W/ TWL XL LVL3 (GOWN DISPOSABLE) ×3 IMPLANT
GOWN STRL REUS W/TWL LRG LVL3 (GOWN DISPOSABLE) ×1
GOWN STRL REUS W/TWL XL LVL3 (GOWN DISPOSABLE) ×1
GUIDEWIRE 0.038 PTFE COATED (WIRE) IMPLANT
GUIDEWIRE ANG ZIPWIRE 038X150 (WIRE) IMPLANT
GUIDEWIRE STR DUAL SENSOR (WIRE) ×4 IMPLANT
IV NS IRRIG 3000ML ARTHROMATIC (IV SOLUTION) ×8 IMPLANT
KIT BALLIN UROMAX 15FX10 (LABEL) ×3 IMPLANT
KIT BALLN UROMAX 15FX4 (MISCELLANEOUS) IMPLANT
KIT BALLN UROMAX 26 75X4 (MISCELLANEOUS)
PACK CYSTO (CUSTOM PROCEDURE TRAY) ×4 IMPLANT
SET HIGH PRES BAL DIL (LABEL) ×1
SHEATH ACCESS URETERAL 38CM (SHEATH) ×4 IMPLANT
STENT URET 6FRX24 CONTOUR (STENTS) ×4 IMPLANT
WATER STERILE IRR 3000ML UROMA (IV SOLUTION) IMPLANT

## 2014-12-23 NOTE — Op Note (Signed)
PATIENT:  Stefanie Everett  PRE-OPERATIVE DIAGNOSIS: Left renal pelvic stone  POST-OPERATIVE DIAGNOSIS: Same  PROCEDURE: 1. Cystoscopy with left retrograde pyelogram including interpretation.   2. Left double-J stent placement  SURGEON:  Claybon Jabs  INDICATION: Stefanie Everett is a 29 year old female with a faint left renal pelvic stone causing intermittent pain. It had Hounsfield units of 500. She was not felt to be a good candidate for lithotripsy and is brought to the operating room today for ureteroscopic management of her stone.  ANESTHESIA:  General  EBL:  Minimal  DRAINS: 6 French, 24 cm double-J stent (no string)  LOCAL MEDICATIONS USED:  None  SPECIMEN:  None  Description of procedure: After informed consent the patient was taken to the operating room and placed on the table in a supine position. General anesthesia was then administered. Once fully anesthetized the patient was moved to the dorsal lithotomy position and the genitalia were sterilely prepped and draped in standard fashion. An official timeout was then performed.  Initially the 62 French cystoscope with 30 lens was passed into the bladder and the bladder was fully and systematically inspected. There were no tumors, stones or inflammatory lesions noted. Both ureteral orifices were of normal configuration and position.  Left retrograde pyelography was performed first by passing a 6 [place] open-ended ureteral catheter through the cystoscope and into the left ureteral orifice. I then injected 5 mL of full strength Omnipaque contrast through the open-ended stent and up the left ureter under direct fluoroscopy which revealed what appeared to be a normal caliber ureter throughout the length with a filling defect located at the UPJ/renal pelvic region consistent with her known stone.  A 0.038 inch floppy-tipped guidewire was then passed through the open ended catheter and up the ureter under direct fluoroscopy into the area  the renal pelvis and the open-ended catheter was removed. I then passed the inner portion of the ureteral access sheath over the guidewire in order to gently dilate the intramural ureter however I met a great deal of resistance while trying to advance this beyond the intramural ureter. I therefore removed the inner portion of the access sheath and left the guidewire in place. I then backloaded the cystoscope over the guidewire and passed a ureteral dilating balloon over the guidewire and up the left ureter with the balloon positioned within the intramural ureter and beyond. I inflated the balloon and left it inflated for 60 seconds and then deflated this. I then removed the dilating balloon and again tried to pass the inner portion of the access sheath over the guidewire but again was only able to advance it a short distance up the ureter. I left the guidewire in place and removed the inner portion of the access sheath and passed the digital, flexible ureteroscope over the guidewire in an attempt to negotiate the intramural ureter and passed the scope up to the kidney but was unable to advance this scope either.   Rather than risk injury to the ureter I elected not to attempt to dilate the ureter further but rather place a double-J stent and return once the stent had been in for a while. I therefore chose a 6 French stent and found that passing this up the ureter was extremely difficult as well. There was a great deal of resistance although I was able to pass it with persistent pressure and was able to position it within the renal pelvis. The guidewire was removed and good curl was noted in the  renal pelvis and within the bladder. I then drained the bladder, removed the cystoscope and the patient was awakened and taken to the recovery room in stable and satisfactory condition. She tolerated procedure well with no intraoperative complications.  Her stent will be left in place and she will be scheduled for repeat  attempted ureteroscopy.    PLAN OF CARE: Discharge to home after PACU  PATIENT DISPOSITION:  PACU - hemodynamically stable.

## 2014-12-23 NOTE — Anesthesia Procedure Notes (Signed)
Procedure Name: LMA Insertion Date/Time: 12/23/2014 7:28 AM Performed by: Maris BergerENENNY, Stefanie Inman T Pre-anesthesia Checklist: Patient identified, Emergency Drugs available, Suction available and Patient being monitored Patient Re-evaluated:Patient Re-evaluated prior to inductionOxygen Delivery Method: Circle System Utilized Preoxygenation: Pre-oxygenation with 100% oxygen Intubation Type: IV induction Ventilation: Mask ventilation without difficulty LMA: LMA inserted LMA Size: 4.0 Number of attempts: 1 Airway Equipment and Method: Bite block Placement Confirmation: positive ETCO2 Tube secured with: Tape Dental Injury: Teeth and Oropharynx as per pre-operative assessment

## 2014-12-23 NOTE — Anesthesia Preprocedure Evaluation (Addendum)
Anesthesia Evaluation  Patient identified by MRN, date of birth, ID band Patient awake    Reviewed: Allergy & Precautions, NPO status , Patient's Chart, lab work & pertinent test results  Airway Mallampati: II  TM Distance: >3 FB Neck ROM: Full    Dental no notable dental hx.    Pulmonary asthma ,  breath sounds clear to auscultation  Pulmonary exam normal       Cardiovascular + dysrhythmias (s/p ablation 2007) Supra Ventricular Tachycardia Rhythm:Regular Rate:Normal     Neuro/Psych negative neurological ROS  negative psych ROS   GI/Hepatic negative GI ROS, Neg liver ROS,   Endo/Other  negative endocrine ROS  Renal/GU negative Renal ROS  negative genitourinary   Musculoskeletal negative musculoskeletal ROS (+)   Abdominal   Peds negative pediatric ROS (+)  Hematology negative hematology ROS (+)   Anesthesia Other Findings   Reproductive/Obstetrics negative OB ROS                            Anesthesia Physical Anesthesia Plan  ASA: II  Anesthesia Plan: General   Post-op Pain Management:    Induction: Intravenous  Airway Management Planned: LMA  Additional Equipment:   Intra-op Plan:   Post-operative Plan: Extubation in OR  Informed Consent: I have reviewed the patients History and Physical, chart, labs and discussed the procedure including the risks, benefits and alternatives for the proposed anesthesia with the patient or authorized representative who has indicated his/her understanding and acceptance.   Dental advisory given  Plan Discussed with: CRNA  Anesthesia Plan Comments: (Pt diagnosed with strep throat 2 days ago. On oral ABX. No fever but sore throat persists. Discussed delaying surgery. Patient wishes to proceed despite increased risk of complications. Warned of increased risk of exacerbating sore throat post op. )        Anesthesia Quick Evaluation

## 2014-12-23 NOTE — Transfer of Care (Signed)
Immediate Anesthesia Transfer of Care Note  Patient: Stefanie Everett  Procedure(s) Performed: Procedure(s): LEFT WITH RETROGRADE PYELOGRAM, URETEROSCOPY LASER LITHO  AND STENT PLACEMENT (Left) HOLMIUM LASER APPLICATION (Left)  Patient Location: PACU  Anesthesia Type:General  Level of Consciousness: sedated and responds to stimulation  Airway & Oxygen Therapy: Patient Spontanous Breathing and Patient connected to face mask oxygen  Post-op Assessment: Report given to RN  Post vital signs: Reviewed and stable  Last Vitals:  Filed Vitals:   12/23/14 0620  BP: 105/63  Pulse: 91  Temp: 36.9 C  Resp: 14    Complications: No apparent anesthesia complications

## 2014-12-23 NOTE — Discharge Instructions (Signed)
Post stent placement instructions ° ° °Definitions: ° °Ureter: The duct that transports urine from the kidney to the bladder. °Stent: A plastic hollow tube that is placed into the ureter, from the kidney to the bladder to prevent the ureter from swelling shut. ° °General instructions: ° °Despite the fact that no skin incisions were used, the area around the ureter and bladder is raw and irritated. The stent is a foreign body which can further irritate the bladder wall. This irritation is manifested by increased frequency of urination, both day and night, and by an increase in the urge to urinate. In some, the urge to urinate is present almost always. Sometimes the urge is strong enough that you may not be able to stop your self from urinating. This can often be controlled with medication but does not occur in everyone. A stent can safely be left in place for 3 months or greater. ° °You may see some blood in your urine while the stent is in place and a few days afterward. Do not be alarmed, even if the urine is clear for a while. Get off your feet and drink lots of fluids until clearing occurs. If you start to pass clots or don't improve, call us. ° °Diet: ° °You may return to your normal diet immediately. Because of the raw surface of your bladder, alcohol, spicy foods, foods high in acid and drinks with caffeine may cause irritation or frequency and should be used in moderation. To keep your urine flowing freely and avoid constipation, drink plenty of fluids during the day (8-10 glasses). Tip: Avoid cranberry juice because it is very acidic. ° °Activity: ° °Your physical activity doesn't need to be restricted. However, if you are very active, you may see some blood in the urine. We suggest that you reduce your activity under the circumstances until the bleeding has stopped. ° °Bowels: ° °It is important to keep your bowels regular during the postoperative period. Straining with bowel movements can cause bleeding. A  bowel movement every other day is reasonable. Use a mild laxative if needed, such as milk of magnesia 2-3 tablespoons, or 2 Dulcolax tablets. Call if you continue to have problems. If you had been taking narcotics for pain, before, during or after your surgery, you may be constipated. Take a laxative if necessary. ° °Medication: ° °You should resume your pre-surgery medications unless told not to. In addition you may be given an antibiotic to prevent or treat infection. Antibiotics are not always necessary. All medication should be taken as prescribed until the bottles are finished unless you are having an unusual reaction to one of the drugs. ° °Problems you should report to us: ° °a. Fever greater than 101°F. °b. Heavy bleeding, or clots (see notes above about blood in urine). °c. Inability to urinate. °d. Drug reactions (hives, rash, nausea, vomiting, diarrhea). °e. Severe burning or pain with urination that is not improving. ° ° ° °Post Anesthesia Home Care Instructions ° °Activity: °Get plenty of rest for the remainder of the day. A responsible adult should stay with you for 24 hours following the procedure.  °For the next 24 hours, DO NOT: °-Drive a car °-Operate machinery °-Drink alcoholic beverages °-Take any medication unless instructed by your physician °-Make any legal decisions or sign important papers. ° °Meals: °Start with liquid foods such as gelatin or soup. Progress to regular foods as tolerated. Avoid greasy, spicy, heavy foods. If nausea and/or vomiting occur, drink only clear liquids until the   nausea and/or vomiting subsides. Call your physician if vomiting continues. ° °Special Instructions/Symptoms: °Your throat may feel dry or sore from the anesthesia or the breathing tube placed in your throat during surgery. If this causes discomfort, gargle with warm salt water. The discomfort should disappear within 24 hours. ° °

## 2014-12-23 NOTE — H&P (Signed)
Stefanie Everett is a 29 year old female with a left renal pelvic stone.   History of Present Illness She initially developed nausea and vomiting as well as gross hematuria.  She underwent a CT scan on 08/20/14 which revealed a 4 x 6 mm stone in the area of the renal pelvis on the left-hand side with no other renal calculi noted. It had Hounsfield units of 500. She had not seen the stone pass and then developed intermittent left flank pain again associated with nausea and vomiting and a KUB was obtained on 12/10/14 which revealed the stone remained present within the area of the left renal pelvis. It was fairly faint. She's not seen any further hematuria. The pain has been on and off over the past 4 months. She has no prior history of kidney stones. She has a strong family history of stones with both mother and father having had multiple kidney stones.  Her left renal stone would be considered of mild severity with no modifying factors or associated signs and symptoms of that as noted above.   Past Medical History Problems  1. History of arthritis (Z87.39) 2. History of asthma (Z87.09) 3. History of cardiac arrhythmia (Z86.79)  Surgical History Problems  1. History of Catheter Ablation Atrial Supraventricular Tachycardia 2. History of Cesarean Section 3. History of Ear Surgery Eustachian Tube  Current Meds 1. Hydrocodone-Acetaminophen 5-325 MG Oral Tablet;  Therapy: (Recorded:11Mar2016) to Recorded 2. Multi Vitamin/Minerals TABS;  Therapy: (Recorded:11Mar2016) to Recorded 3. Proventil 90 MCG/ACT AERS; INHALE PUFFS  PRN;  Therapy: (Recorded:11Mar2016) to Recorded 4. Tylenol CAPS; TAKE TABLET  PRN;  Therapy: (Recorded:11Mar2016) to Recorded 5. Zofran TABS; TAKE TABLET  PRN;  Therapy: (Recorded:11Mar2016) to Recorded  Allergies Medication  1. Penicillins  Family History Problems  1. Family history of Emphysema lung : Mother 2. Family history of chronic obstructive pulmonary disease (Z82.5) :  Mother 3. Family history of diabetes mellitus (Z83.3) : Mother 4. Family history of hypertension (Z82.49) : Mother 5. Family history of kidney stones (Z84.1) : Mother, Father  Social History Problems    Denied: History of Alcohol use   Caffeine use (F15.90)   approx. 1 a day   Never a smoker   Number of children   1 daughter   Single  Review of Systems Genitourinary, constitutional, skin, eye, otolaryngeal, hematologic/lymphatic, cardiovascular, pulmonary, endocrine, musculoskeletal, gastrointestinal, neurological and psychiatric system(s) were reviewed and pertinent findings if present are noted and are otherwise negative.  Genitourinary: urinary frequency, urinary urgency, nocturia, urinary stream starts and stops, hematuria and dyspareunia.  Gastrointestinal: nausea, vomiting and heartburn.  Constitutional: feeling tired (fatigue).  Respiratory: shortness of breath.  Musculoskeletal: back pain.    Vitals Vital Signs  Height: 5 ft 3.5 in Weight: 178 lb  BMI Calculated: 31.04 BSA Calculated: 1.85 Blood Pressure: 111 / 72 Temperature: 98.1 F Heart Rate: 98  Physical Exam Constitutional: Well nourished and well developed . No acute distress.   ENT:. The ears and nose are normal in appearance.   Neck: The appearance of the neck is normal and no neck mass is present.   Pulmonary: No respiratory distress and normal respiratory rhythm and effort.   Cardiovascular: Heart rate and rhythm are normal . No peripheral edema.   Abdomen: The abdomen is soft and nontender. No masses are palpated. mild left CVA tenderness. No hernias are palpable. No hepatosplenomegaly noted.   Lymphatics: The femoral and inguinal nodes are not enlarged or tender.   Skin: Normal skin turgor, no visible rash and  no visible skin lesions.   Neuro/Psych:. Mood and affect are appropriate.    Results/Data Urine  COLOR YELLOW  APPEARANCE CLEAR  SPECIFIC GRAVITY 1.030  pH 6.0  GLUCOSE NEG  mg/dL BILIRUBIN NEG  KETONE NEG mg/dL BLOOD MOD  PROTEIN TRACE mg/dL UROBILINOGEN 0.2 mg/dL NITRITE NEG  LEUKOCYTE ESTERASE NEG  SQUAMOUS EPITHELIAL/HPF MODERATE  WBC 0-2 WBC/hpf RBC 3-6 RBC/hpf BACTERIA FEW  CRYSTALS NONE SEEN  CASTS Hyaline casts noted  Other MUCUS NOTED   Old records or history reviewed: [facility]  The following images/tracing/specimen were independently visualized:  CT scan and KUB as above.  The following clinical lab reports were reviewed:  UA: Red cells were noted. It did not appear infected in any way.    Assessment   We discussed the management of urinary stones. These options include observation, ureteroscopy, shockwave lithotripsy, and PCNL. We discussed which options are relevant to these particular stones. We discussed the natural history of stones as well as the complications of untreated stones and the impact on quality of life without treatment as well as with each of the above listed treatments. We also discussed the efficacy of each treatment in its ability to clear the stone burden. With any of these management options I discussed the signs and symptoms of infection and the need for emergent treatment should these be experienced. For each option we discussed the ability of each procedure to clear the patient of their stone burden.    For observation I described the risks which include but are not limited to silent renal damage, life-threatening infection, need for emergent surgery, failure to pass stone, and pain.    For ureteroscopy I described the risks which include heart attack, stroke, pulmonary embolus, death, bleeding, infection, damage to contiguous structures, positioning injury, ureteral stricture, ureteral avulsion, ureteral injury, need for ureteral stent, inability to perform ureteroscopy, need for an interval procedure, inability to clear stone burden, stent discomfort and pain.    For shockwave lithotripsy I  described the risks which include arrhythmia, kidney contusion, kidney hemorrhage, need for transfusion, long-term risk of diabetes or hypertension, back discomfort, flank ecchymosis, flank abrasion, inability to break up stone, inability to pass stone fragments, Steinstrasse, infection associated with obstructing stones, need for different surgical procedure, need for repeat shockwave lithotripsy, and death.    Her stone is faint and I think there is a very good chance that I would not be able to see it for lithotripsy and so we discussed proceeding with ureteroscopy. She is in agreement with this plan.   Plan   She will be scheduled for cystoscopy, left retrograde pyelogram, left ureteroscopy and laser lithotripsy of her left renal pelvis stone.

## 2014-12-23 NOTE — Anesthesia Postprocedure Evaluation (Signed)
  Anesthesia Post-op Note  Patient: Stefanie Everett  Procedure(s) Performed: Procedure(s) (LRB): LEFT URETEROSCOPY, RETROGRADE PYELOGRAM,  AND STENT PLACEMENT (Left) CYSTOSCOPY AND URETERAL DILITATION (Right)  Patient Location: PACU  Anesthesia Type: General  Level of Consciousness: awake and alert   Airway and Oxygen Therapy: Patient Spontanous Breathing  Post-op Pain: mild  Post-op Assessment: Post-op Vital signs reviewed, Patient's Cardiovascular Status Stable, Respiratory Function Stable, Patent Airway and No signs of Nausea or vomiting  Last Vitals:  Filed Vitals:   12/23/14 1011  BP: 92/56  Pulse: 67  Temp: 36.9 C  Resp: 16    Post-op Vital Signs: stable   Complications: No apparent anesthesia complications

## 2014-12-24 ENCOUNTER — Encounter (HOSPITAL_BASED_OUTPATIENT_CLINIC_OR_DEPARTMENT_OTHER): Payer: Self-pay | Admitting: Urology

## 2014-12-31 ENCOUNTER — Encounter (HOSPITAL_BASED_OUTPATIENT_CLINIC_OR_DEPARTMENT_OTHER): Payer: Self-pay | Admitting: *Deleted

## 2014-12-31 ENCOUNTER — Other Ambulatory Visit: Payer: Self-pay | Admitting: Urology

## 2014-12-31 NOTE — Progress Notes (Signed)
NPO AFTER MN. ARRIVE AT 0600. CURRENT LAB RESULTS AND EKG IN CHART AND EPIC. MAY TAKE PAIN/NAUSEA RX IF NEEDED AM DOS W/ SIPS OF WATER.

## 2015-01-03 ENCOUNTER — Encounter (HOSPITAL_BASED_OUTPATIENT_CLINIC_OR_DEPARTMENT_OTHER)
Admission: RE | Disposition: A | Discharge status: Home / Self Care | Payer: Self-pay | Source: Ambulatory Visit | Attending: Urology

## 2015-01-03 ENCOUNTER — Ambulatory Visit (HOSPITAL_BASED_OUTPATIENT_CLINIC_OR_DEPARTMENT_OTHER): Payer: BLUE CROSS/BLUE SHIELD | Admitting: Anesthesiology

## 2015-01-03 ENCOUNTER — Ambulatory Visit (HOSPITAL_BASED_OUTPATIENT_CLINIC_OR_DEPARTMENT_OTHER)
Admission: RE | Admit: 2015-01-03 | Discharge: 2015-01-03 | Disposition: A | Discharge status: Home / Self Care | Payer: BLUE CROSS/BLUE SHIELD | Source: Ambulatory Visit | Attending: Urology | Admitting: Urology

## 2015-01-03 ENCOUNTER — Encounter (HOSPITAL_BASED_OUTPATIENT_CLINIC_OR_DEPARTMENT_OTHER): Payer: Self-pay

## 2015-01-03 DIAGNOSIS — J45909 Unspecified asthma, uncomplicated: Secondary | ICD-10-CM | POA: Diagnosis not present

## 2015-01-03 DIAGNOSIS — Z79891 Long term (current) use of opiate analgesic: Secondary | ICD-10-CM | POA: Diagnosis not present

## 2015-01-03 DIAGNOSIS — Z79899 Other long term (current) drug therapy: Secondary | ICD-10-CM | POA: Insufficient documentation

## 2015-01-03 DIAGNOSIS — M1990 Unspecified osteoarthritis, unspecified site: Secondary | ICD-10-CM | POA: Insufficient documentation

## 2015-01-03 DIAGNOSIS — N200 Calculus of kidney: Secondary | ICD-10-CM | POA: Diagnosis present

## 2015-01-03 HISTORY — PX: HOLMIUM LASER APPLICATION: SHX5852

## 2015-01-03 HISTORY — PX: CYSTOSCOPY W/ URETERAL STENT PLACEMENT: SHX1429

## 2015-01-03 HISTORY — PX: CYSTOSCOPY WITH URETEROSCOPY: SHX5123

## 2015-01-03 LAB — POCT PREGNANCY, URINE: Preg Test, Ur: NEGATIVE

## 2015-01-03 SURGERY — HOLMIUM LASER APPLICATION
Anesthesia: General | Site: Ureter | Laterality: Left

## 2015-01-03 MED ORDER — MIDAZOLAM HCL 2 MG/2ML IJ SOLN
INTRAMUSCULAR | Status: AC
  Filled 2015-01-03: qty 2

## 2015-01-03 MED ORDER — PHENAZOPYRIDINE HCL 100 MG PO TABS
ORAL_TABLET | ORAL | Status: AC
  Filled 2015-01-03: qty 2

## 2015-01-03 MED ORDER — TAMSULOSIN HCL 0.4 MG PO CAPS
ORAL_CAPSULE | ORAL | Status: AC
  Filled 2015-01-03: qty 1

## 2015-01-03 MED ORDER — PROPOFOL 10 MG/ML IV BOLUS
INTRAVENOUS | Status: DC | PRN
  Administered 2015-01-03: 180 mg via INTRAVENOUS

## 2015-01-03 MED ORDER — ACETAMINOPHEN 10 MG/ML IV SOLN
INTRAVENOUS | Status: DC | PRN
  Administered 2015-01-03: 1000 mg via INTRAVENOUS

## 2015-01-03 MED ORDER — LACTATED RINGERS IV SOLN
INTRAVENOUS | Status: DC
  Administered 2015-01-03 (×2): via INTRAVENOUS
  Filled 2015-01-03: qty 1000

## 2015-01-03 MED ORDER — OXYBUTYNIN CHLORIDE 5 MG PO TABS
5.0000 mg | ORAL_TABLET | Freq: Once | ORAL | Status: AC
  Administered 2015-01-03: 5 mg via ORAL
  Filled 2015-01-03: qty 1

## 2015-01-03 MED ORDER — DEXAMETHASONE SODIUM PHOSPHATE 4 MG/ML IJ SOLN
INTRAMUSCULAR | Status: DC | PRN
  Administered 2015-01-03: 10 mg via INTRAVENOUS

## 2015-01-03 MED ORDER — LIDOCAINE HCL 1 % IJ SOLN
INTRAMUSCULAR | Status: AC
  Filled 2015-01-03: qty 20

## 2015-01-03 MED ORDER — MIDAZOLAM HCL 5 MG/5ML IJ SOLN
INTRAMUSCULAR | Status: DC | PRN
  Administered 2015-01-03: 2 mg via INTRAVENOUS

## 2015-01-03 MED ORDER — SODIUM CHLORIDE 0.9 % IR SOLN
Status: DC | PRN
  Administered 2015-01-03: 4000 mL

## 2015-01-03 MED ORDER — HYDROMORPHONE HCL 1 MG/ML IJ SOLN
INTRAMUSCULAR | Status: AC
  Filled 2015-01-03: qty 1

## 2015-01-03 MED ORDER — HYDROCODONE-ACETAMINOPHEN 10-325 MG PO TABS: 1.0000 | ORAL_TABLET | ORAL | Status: AC | PRN

## 2015-01-03 MED ORDER — IOHEXOL 350 MG/ML SOLN
INTRAVENOUS | Status: DC | PRN
  Administered 2015-01-03: 3 mL

## 2015-01-03 MED ORDER — OXYBUTYNIN CHLORIDE 5 MG PO TABS
ORAL_TABLET | ORAL | Status: AC
  Filled 2015-01-03: qty 1

## 2015-01-03 MED ORDER — PROMETHAZINE HCL 25 MG/ML IJ SOLN
6.2500 mg | INTRAMUSCULAR | Status: DC | PRN
  Filled 2015-01-03: qty 1

## 2015-01-03 MED ORDER — OXYCODONE HCL 5 MG PO TABS
5.0000 mg | ORAL_TABLET | Freq: Once | ORAL | Status: AC | PRN
  Administered 2015-01-03: 5 mg via ORAL
  Filled 2015-01-03: qty 1

## 2015-01-03 MED ORDER — FENTANYL CITRATE 0.05 MG/ML IJ SOLN
INTRAMUSCULAR | Status: AC
  Filled 2015-01-03: qty 4

## 2015-01-03 MED ORDER — FENTANYL CITRATE 0.05 MG/ML IJ SOLN
INTRAMUSCULAR | Status: DC | PRN
  Administered 2015-01-03: 50 ug via INTRAVENOUS
  Administered 2015-01-03 (×4): 25 ug via INTRAVENOUS

## 2015-01-03 MED ORDER — TAMSULOSIN HCL 0.4 MG PO CAPS
0.4000 mg | ORAL_CAPSULE | Freq: Once | ORAL | Status: AC
  Administered 2015-01-03: 0.4 mg via ORAL
  Filled 2015-01-03: qty 1

## 2015-01-03 MED ORDER — OXYCODONE HCL 5 MG PO TABS
ORAL_TABLET | ORAL | Status: AC
  Filled 2015-01-03: qty 1

## 2015-01-03 MED ORDER — CIPROFLOXACIN IN D5W 200 MG/100ML IV SOLN
INTRAVENOUS | Status: AC
  Filled 2015-01-03: qty 100

## 2015-01-03 MED ORDER — PHENAZOPYRIDINE HCL 200 MG PO TABS: 200.0000 mg | ORAL_TABLET | Freq: Three times a day (TID) | ORAL | Status: AC | PRN

## 2015-01-03 MED ORDER — PHENAZOPYRIDINE HCL 200 MG PO TABS
200.0000 mg | ORAL_TABLET | Freq: Once | ORAL | Status: AC
  Administered 2015-01-03: 200 mg via ORAL
  Filled 2015-01-03: qty 1

## 2015-01-03 MED ORDER — LIDOCAINE HCL (CARDIAC) 20 MG/ML IV SOLN
INTRAVENOUS | Status: DC | PRN
  Administered 2015-01-03: 60 mg via INTRAVENOUS

## 2015-01-03 MED ORDER — HYDROMORPHONE HCL 1 MG/ML IJ SOLN
0.2500 mg | INTRAMUSCULAR | Status: DC | PRN
  Administered 2015-01-03 (×4): 0.25 mg via INTRAVENOUS
  Filled 2015-01-03: qty 1

## 2015-01-03 MED ORDER — OXYCODONE HCL 5 MG/5ML PO SOLN
5.0000 mg | Freq: Once | ORAL | Status: AC | PRN
  Filled 2015-01-03: qty 5

## 2015-01-03 MED ORDER — ONDANSETRON HCL 4 MG/2ML IJ SOLN
INTRAMUSCULAR | Status: DC | PRN
  Administered 2015-01-03: 4 mg via INTRAVENOUS

## 2015-01-03 MED ORDER — CIPROFLOXACIN IN D5W 200 MG/100ML IV SOLN
200.0000 mg | INTRAVENOUS | Status: AC
  Administered 2015-01-03: 200 mg via INTRAVENOUS
  Filled 2015-01-03: qty 100

## 2015-01-03 SURGICAL SUPPLY — 41 items
ADAPTER CATH URET PLST 4-6FR (CATHETERS) IMPLANT
BAG DRAIN URO-CYSTO SKYTR STRL (DRAIN) ×3 IMPLANT
BASKET LASER NITINOL 1.9FR (BASKET) IMPLANT
BASKET STNLS GEMINI 4WIRE 3FR (BASKET) IMPLANT
BASKET ZERO TIP NITINOL 2.4FR (BASKET) ×3 IMPLANT
CANISTER SUCT LVC 12 LTR MEDI- (MISCELLANEOUS) ×3 IMPLANT
CATH CLEAR GEL 3F BACKSTOP (CATHETERS) IMPLANT
CATH INTERMIT  6FR 70CM (CATHETERS) IMPLANT
CATH URET 5FR 28IN CONE TIP (BALLOONS)
CATH URET 5FR 70CM CONE TIP (BALLOONS) IMPLANT
CLOTH BEACON ORANGE TIMEOUT ST (SAFETY) ×3 IMPLANT
ELECT REM PT RETURN 9FT ADLT (ELECTROSURGICAL)
ELECTRODE REM PT RTRN 9FT ADLT (ELECTROSURGICAL) IMPLANT
FIBER LASER FLEXIVA 200 (UROLOGICAL SUPPLIES) IMPLANT
FIBER LASER FLEXIVA 365 (UROLOGICAL SUPPLIES) IMPLANT
FIBER LASER FLEXIVA 550 (UROLOGICAL SUPPLIES) IMPLANT
FIBER LASER TRAC TIP (UROLOGICAL SUPPLIES) ×3 IMPLANT
GLOVE BIO SURGEON STRL SZ8 (GLOVE) ×3 IMPLANT
GLOVE BIOGEL M 7.0 STRL (GLOVE) ×15 IMPLANT
GLOVE BIOGEL PI IND STRL 7.0 (GLOVE) ×4 IMPLANT
GLOVE BIOGEL PI INDICATOR 7.0 (GLOVE) ×2
GOWN PREVENTION PLUS LG XLONG (DISPOSABLE) IMPLANT
GOWN STRL REIN XL XLG (GOWN DISPOSABLE) IMPLANT
GOWN STRL REUS W/TWL LRG LVL3 (GOWN DISPOSABLE) ×9 IMPLANT
GOWN STRL REUS W/TWL XL LVL3 (GOWN DISPOSABLE) ×6 IMPLANT
GUIDEWIRE 0.038 PTFE COATED (WIRE) IMPLANT
GUIDEWIRE ANG ZIPWIRE 038X150 (WIRE) IMPLANT
GUIDEWIRE STR DUAL SENSOR (WIRE) ×6 IMPLANT
IV NS 1000ML (IV SOLUTION) ×1
IV NS 1000ML BAXH (IV SOLUTION) ×2 IMPLANT
IV NS IRRIG 3000ML ARTHROMATIC (IV SOLUTION) ×3 IMPLANT
KIT BALLIN UROMAX 15FX10 (LABEL) IMPLANT
KIT BALLN UROMAX 15FX4 (MISCELLANEOUS) IMPLANT
KIT BALLN UROMAX 26 75X4 (MISCELLANEOUS)
NS IRRIG 500ML POUR BTL (IV SOLUTION) ×3 IMPLANT
PACK CYSTO (CUSTOM PROCEDURE TRAY) ×6 IMPLANT
SET HIGH PRES BAL DIL (LABEL)
SHEATH ACCESS URETERAL 38CM (SHEATH) ×3 IMPLANT
SHEATH ACCESS URETERAL 54CM (SHEATH) ×3 IMPLANT
STENT PERCUFLEX 4.8FRX24 (STENTS) ×6 IMPLANT
WATER STERILE IRR 3000ML UROMA (IV SOLUTION) IMPLANT

## 2015-01-03 NOTE — Op Note (Signed)
Preoperative diagnosis: Left renal calculus  Postoperative diagnosis: Left renal calculus  Procedure:  1. Cystoscopy with Left ureteroscopy, laser lithotripsy and stone removal 2. Left ureteral dilation 3. Left ureteral stent removal 4. Left ureteral stent placement (5Fr x 24 cm) 5. Left retrograde pyelography with interpretation  Surgeon: Dr. Ihor GullyMark Ottelin  Resident: Elon Jesteravid C. Devyon Keator, MD  Anesthesia: General  Complications: None  Intraoperative findings: Left retrograde pyelography demonstrated no hydronephrosis with small renal pelvis. Stent curled in upper pole after final replacement.  Single small stone that had been pushed back into renal pelvis at prior stent placement. Dusted into passable particles. Single larger fragment no bigger than the wire was extracted for analysis.  EBL: Minimal  Specimens: 1. Left renal calculus  Disposition of specimens: [facility] for stone analysis  Indication: Stefanie Everett is a 29 y.o. female patient with urolithiasis s/p stent placement for narrow ureter preventing passage of instruments on initial attempt. After reviewing the management options for treatment, they elected to proceed with the above surgical procedure(s). We have discussed the potential benefits and risks of the procedure, side effects of the proposed treatment, the likelihood of the patient achieving the goals of the procedure, and any potential problems that might occur during the procedure or recuperation. Informed consent has been obtained.  Description of procedure:  The patient was taken to the operating room and general anesthesia was induced.  The patient was placed in the dorsal lithotomy position, prepped and draped in the usual sterile fashion, and preoperative antibiotics were administered. A preoperative time-out was performed.   Cystourethroscopy was performed.  The patient's urethra was examined and was normal. The bladder was then  systematically examined in its entirety. There was no evidence for any bladder tumors, stones, or other mucosal pathology.    Attention then turned to the Left ureteral orifice where the stent was grasped and pulled to the meatus. A 0.38 sensor guidewire was then advanced up the Left ureter through the stent into the renal pelvis under fluoroscopic guidance.  We attempted to pass the flexible scope over the wire but were unable to due to the narrow ureter despite 11 days of stenting. We then advanced the inner sheath of a 12/14 Fr ureteral access sheath over the guide wire to dilate it. The digital flexible ureteroscope was then advanced over the wire into the kidney where the calculus was identified in the lower pole. The 0 tip nitinol basket was used to relocate the stone into the upper pole. The stone was then dusted with the 200 micron holmium laser fiber on a setting of 0.5J and frequency of 20 Hz. A single stone fragment about 2mm in size was then removed with the basket. The remaining fragments were too small to basket and would pass spontaneously. The ureter was free of stone as we extracted the stone.  The safety wire was then replaced and a 5Fr x 24cm ureteral stent was placed with good positioning confirmed on fluoroscopy. Prior to the patient being awoken from GA, it was unfortunately pulled out so a retrograde was performed demonstrating a small renal pelvis but spacious upper pole. The wire was again replaced and a new stent was placed with a string still attached. The wire was removed and the stent was positioned appropriately in the upper pole/  The bladder was then emptied and the procedure ended.  The patient appeared to tolerate the procedure well and without complications.  The patient was able to be awakened and transferred  to the recovery unit in satisfactory condition.

## 2015-01-03 NOTE — Addendum Note (Signed)
Addendum  created 01/03/15 1105 by Francie MassingLisa D Kasarah Sitts, CRNA   Modules edited: Anesthesia Blocks and Procedures, Clinical Notes   Clinical Notes:  File: [id number]

## 2015-01-03 NOTE — Transfer of Care (Signed)
Immediate Anesthesia Transfer of Care Note  Patient: Stefanie Everett  Procedure(s) Performed: Procedure(s) (LRB): LEFT HOLMIUM LASER LITHO (Left) CYSTOSCOPY WITH STENT REPLACEMENT (Left) CYSTOSCOPY WITH URETEROSCOPY WITH STONE EXTRACTION (Left)  Patient Location: PACU  Anesthesia Type: General  Level of Consciousness: awake, oriented, sedated and patient cooperative  Airway & Oxygen Therapy: Patient Spontanous Breathing and Patient connected to face mask oxygen  Post-op Assessment: Report given to PACU RN and Post -op Vital signs reviewed and stable  Post vital signs: Reviewed and stable  Complications: No apparent anesthesia complications

## 2015-01-03 NOTE — Anesthesia Postprocedure Evaluation (Signed)
  Anesthesia Post-op Note  Patient: Stefanie Everett  Procedure(s) Performed: Procedure(s) with comments: LEFT HOLMIUM LASER LITHO (Left) CYSTOSCOPY WITH STENT REPLACEMENT (Left) - re insertion of stent CYSTOSCOPY WITH URETEROSCOPY WITH STONE EXTRACTION (Left)  Patient Location: PACU  Anesthesia Type:General  Level of Consciousness: awake and alert   Airway and Oxygen Therapy: Patient Spontanous Breathing  Post-op Pain: mild  Post-op Assessment: Post-op Vital signs reviewed  Post-op Vital Signs: stable  Last Vitals:  Filed Vitals:   01/03/15 0915  BP: 117/68  Pulse: 78  Temp:   Resp: 17    Complications: No apparent anesthesia complications

## 2015-01-03 NOTE — Anesthesia Procedure Notes (Signed)
Procedure Name: LMA Insertion Date/Time: 01/03/2015 7:25 AM Performed by: Renella CunasHAZEL, Shardae Kleinman D Pre-anesthesia Checklist: Patient identified, Emergency Drugs available, Suction available and Patient being monitored Patient Re-evaluated:Patient Re-evaluated prior to inductionOxygen Delivery Method: Circle System Utilized Preoxygenation: Pre-oxygenation with 100% oxygen Intubation Type: IV induction Ventilation: Mask ventilation without difficulty LMA: LMA inserted LMA Size: 4.0 Number of attempts: 1 Airway Equipment and Method: Bite block Placement Confirmation: positive ETCO2 Tube secured with: Tape Dental Injury: Teeth and Oropharynx as per pre-operative assessment

## 2015-01-03 NOTE — Anesthesia Preprocedure Evaluation (Addendum)
Anesthesia Evaluation  Patient identified by MRN, date of birth, ID band Patient awake    Reviewed: Allergy & Precautions, NPO status , Patient's Chart, lab work & pertinent test results  Airway Mallampati: II  TM Distance: >3 FB Neck ROM: Full    Dental no notable dental hx. (+) Teeth Intact, Dental Advisory Given   Pulmonary asthma ,  breath sounds clear to auscultation  Pulmonary exam normal       Cardiovascular + dysrhythmias (s/p ablation 2007) Supra Ventricular Tachycardia Rhythm:Regular Rate:Normal     Neuro/Psych negative neurological ROS  negative psych ROS   GI/Hepatic negative GI ROS, Neg liver ROS,   Endo/Other  negative endocrine ROS  Renal/GU      Musculoskeletal   Abdominal   Peds  Hematology negative hematology ROS (+)   Anesthesia Other Findings   Reproductive/Obstetrics negative OB ROS                            Anesthesia Physical Anesthesia Plan  ASA: II  Anesthesia Plan: General LMA   Post-op Pain Management:    Induction:   Airway Management Planned:   Additional Equipment:   Intra-op Plan:   Post-operative Plan:   Informed Consent: I have reviewed the patients History and Physical, chart, labs and discussed the procedure including the risks, benefits and alternatives for the proposed anesthesia with the patient or authorized representative who has indicated his/her understanding and acceptance.     Plan Discussed with: CRNA and Surgeon  Anesthesia Plan Comments:         Anesthesia Quick Evaluation

## 2015-01-03 NOTE — Interval H&P Note (Signed)
History and Physical Interval Note: She underwent a previous attempt at ureteroscopic management of her left ureteral stone however despite a normal appearing ureter on retrograde I was unable to advance the scope up the ureter despite dilation of the intramural ureter and rather then risk injury to the ureter I placed a double-J stent. She's not tolerating the stent particularly well so she is brought back to the operating room today for an attempt at treatment of her stone now that the stent has been indwelling for some time. I have discussed with her the fact that if I am not successful, because she has tolerated the stent so poorly, I would consider leaving the stent out and scheduling her for lithotripsy with the hopes that the stone could be visualized in her kidney better than it was in her ureter.   01/03/2015 7:15 AM  Stefanie BillsAlicia Desanctis  has presented today for surgery, with the diagnosis of LEFT RENAL STONE  The various methods of treatment have been discussed with the patient and family. After consideration of risks, benefits and other options for treatment, the patient has consented to  Procedure(s): LEFT RETROGRADE PYELOGRAM, URETEROSCOPY AND STENT PLACEMENT (Left) LEFT HOLMIUM LASER LITHO (Left) as a surgical intervention .  The patient's history has been reviewed, patient examined, no change in status, stable for surgery.  I have reviewed the patient's chart and labs.  Questions were answered to the patient's satisfaction.     Garnett FarmTTELIN,Takelia Urieta C

## 2015-01-03 NOTE — Discharge Instructions (Signed)

## 2015-01-03 NOTE — H&P (View-Only) (Signed)
Stefanie Everett is a 29 year old female with a left renal pelvic stone.   History of Present Illness She initially developed nausea and vomiting as well as gross hematuria.  She underwent a CT scan on 08/20/14 which revealed a 4 x 6 mm stone in the area of the renal pelvis on the left-hand side with no other renal calculi noted. It had Hounsfield units of 500. She had not seen the stone pass and then developed intermittent left flank pain again associated with nausea and vomiting and a KUB was obtained on 12/10/14 which revealed the stone remained present within the area of the left renal pelvis. It was fairly faint. She's not seen any further hematuria. The pain has been on and off over the past 4 months. She has no prior history of kidney stones. She has a strong family history of stones with both mother and father having had multiple kidney stones.  Her left renal stone would be considered of mild severity with no modifying factors or associated signs and symptoms of that as noted above.   Past Medical History Problems  1. History of arthritis (Z87.39) 2. History of asthma (Z87.09) 3. History of cardiac arrhythmia (Z86.79)  Surgical History Problems  1. History of Catheter Ablation Atrial Supraventricular Tachycardia 2. History of Cesarean Section 3. History of Ear Surgery Eustachian Tube  Current Meds 1. Hydrocodone-Acetaminophen 5-325 MG Oral Tablet;  Therapy: (Recorded:11Mar2016) to Recorded 2. Multi Vitamin/Minerals TABS;  Therapy: (Recorded:11Mar2016) to Recorded 3. Proventil 90 MCG/ACT AERS; INHALE PUFFS  PRN;  Therapy: (Recorded:11Mar2016) to Recorded 4. Tylenol CAPS; TAKE TABLET  PRN;  Therapy: (Recorded:11Mar2016) to Recorded 5. Zofran TABS; TAKE TABLET  PRN;  Therapy: (Recorded:11Mar2016) to Recorded  Allergies Medication  1. Penicillins  Family History Problems  1. Family history of Emphysema lung : Mother 2. Family history of chronic obstructive pulmonary disease (Z82.5) :  Mother 3. Family history of diabetes mellitus (Z83.3) : Mother 4. Family history of hypertension (Z82.49) : Mother 5. Family history of kidney stones (Z84.1) : Mother, Father  Social History Problems    Denied: History of Alcohol use   Caffeine use (F15.90)   approx. 1 a day   Never a smoker   Number of children   1 daughter   Single  Review of Systems Genitourinary, constitutional, skin, eye, otolaryngeal, hematologic/lymphatic, cardiovascular, pulmonary, endocrine, musculoskeletal, gastrointestinal, neurological and psychiatric system(s) were reviewed and pertinent findings if present are noted and are otherwise negative.  Genitourinary: urinary frequency, urinary urgency, nocturia, urinary stream starts and stops, hematuria and dyspareunia.  Gastrointestinal: nausea, vomiting and heartburn.  Constitutional: feeling tired (fatigue).  Respiratory: shortness of breath.  Musculoskeletal: back pain.    Vitals Vital Signs  Height: 5 ft 3.5 in Weight: 178 lb  BMI Calculated: 31.04 BSA Calculated: 1.85 Blood Pressure: 111 / 72 Temperature: 98.1 F Heart Rate: 98  Physical Exam Constitutional: Well nourished and well developed . No acute distress.   ENT:. The ears and nose are normal in appearance.   Neck: The appearance of the neck is normal and no neck mass is present.   Pulmonary: No respiratory distress and normal respiratory rhythm and effort.   Cardiovascular: Heart rate and rhythm are normal . No peripheral edema.   Abdomen: The abdomen is soft and nontender. No masses are palpated. mild left CVA tenderness. No hernias are palpable. No hepatosplenomegaly noted.   Lymphatics: The femoral and inguinal nodes are not enlarged or tender.   Skin: Normal skin turgor, no visible rash and  no visible skin lesions.   Neuro/Psych:. Mood and affect are appropriate.    Results/Data Urine  COLOR YELLOW  APPEARANCE CLEAR  SPECIFIC GRAVITY 1.030  pH 6.0  GLUCOSE NEG  mg/dL BILIRUBIN NEG  KETONE NEG mg/dL BLOOD MOD  PROTEIN TRACE mg/dL UROBILINOGEN 0.2 mg/dL NITRITE NEG  LEUKOCYTE ESTERASE NEG  SQUAMOUS EPITHELIAL/HPF MODERATE  WBC 0-2 WBC/hpf RBC 3-6 RBC/hpf BACTERIA FEW  CRYSTALS NONE SEEN  CASTS Hyaline casts noted  Other MUCUS NOTED   Old records or history reviewed: [facility]  The following images/tracing/specimen were independently visualized:  CT scan and KUB as above.  The following clinical lab reports were reviewed:  UA: Red cells were noted. It did not appear infected in any way.    Assessment   We discussed the management of urinary stones. These options include observation, ureteroscopy, shockwave lithotripsy, and PCNL. We discussed which options are relevant to these particular stones. We discussed the natural history of stones as well as the complications of untreated stones and the impact on quality of life without treatment as well as with each of the above listed treatments. We also discussed the efficacy of each treatment in its ability to clear the stone burden. With any of these management options I discussed the signs and symptoms of infection and the need for emergent treatment should these be experienced. For each option we discussed the ability of each procedure to clear the patient of their stone burden.    For observation I described the risks which include but are not limited to silent renal damage, life-threatening infection, need for emergent surgery, failure to pass stone, and pain.    For ureteroscopy I described the risks which include heart attack, stroke, pulmonary embolus, death, bleeding, infection, damage to contiguous structures, positioning injury, ureteral stricture, ureteral avulsion, ureteral injury, need for ureteral stent, inability to perform ureteroscopy, need for an interval procedure, inability to clear stone burden, stent discomfort and pain.    For shockwave lithotripsy I  described the risks which include arrhythmia, kidney contusion, kidney hemorrhage, need for transfusion, long-term risk of diabetes or hypertension, back discomfort, flank ecchymosis, flank abrasion, inability to break up stone, inability to pass stone fragments, Steinstrasse, infection associated with obstructing stones, need for different surgical procedure, need for repeat shockwave lithotripsy, and death.    Her stone is faint and I think there is a very good chance that I would not be able to see it for lithotripsy and so we discussed proceeding with ureteroscopy. She is in agreement with this plan.   Plan   She will be scheduled for cystoscopy, left retrograde pyelogram, left ureteroscopy and laser lithotripsy of her left renal pelvis stone.

## 2015-01-06 ENCOUNTER — Encounter (HOSPITAL_BASED_OUTPATIENT_CLINIC_OR_DEPARTMENT_OTHER): Payer: Self-pay | Admitting: Urology

## 2015-04-07 ENCOUNTER — Ambulatory Visit (INDEPENDENT_AMBULATORY_CARE_PROVIDER_SITE_OTHER): Payer: BLUE CROSS/BLUE SHIELD | Admitting: Internal Medicine

## 2015-04-07 VITALS — BP 90/60 | HR 82 | Temp 98.3°F | Resp 16 | Ht 64.0 in | Wt 175.0 lb

## 2015-04-07 DIAGNOSIS — R350 Frequency of micturition: Secondary | ICD-10-CM

## 2015-04-07 DIAGNOSIS — N910 Primary amenorrhea: Secondary | ICD-10-CM | POA: Diagnosis not present

## 2015-04-07 LAB — POCT UA - MICROSCOPIC ONLY
Casts, Ur, LPF, POC: NEGATIVE
Crystals, Ur, HPF, POC: NEGATIVE
Yeast, UA: NEGATIVE

## 2015-04-07 LAB — POCT URINE PREGNANCY: PREG TEST UR: POSITIVE — AB

## 2015-04-07 LAB — POCT URINALYSIS DIPSTICK
BILIRUBIN UA: NEGATIVE
Blood, UA: NEGATIVE
Glucose, UA: NEGATIVE
Leukocytes, UA: NEGATIVE
Nitrite, UA: NEGATIVE
PH UA: 7
Protein, UA: NEGATIVE
SPEC GRAV UA: 1.02
Urobilinogen, UA: 0.2

## 2015-04-07 NOTE — Progress Notes (Signed)
Subjective:  This chart was scribed for Stefanie Siaobert Denaly Gatling, MD by [facility], medical scribe [facility].The patient was seen in exam room 04 and the patient's care was started at 8:52 AM.   Patient ID: Stefanie BillsAlicia Everett, female    DOB: 11/27/1985, 29 y.o.   MRN: [id number] Chief Complaint  Patient presents with  . Possible Pregnancy    Positive home test yesterday   HPI  HPI Comments: Stefanie Everett is a 29 y.o. female who presents to [facility] complaining of nausea, right flank pain, abdominal pain and increased urinary frequency. The pain is mild and recurrent and sometimes aggravated by movement, but is not colicky or intents. Symptoms began roughly one week ago. no vaginal discharge. No fever. No dysuria Kidney stone in April she had surgery to remove the stone.  LMP mid April with some spotting first of May. No clear menses since. Try to get pregnant. Has a 27-year-old at home   Past Medical History  Diagnosis Date  . Asthma   . Renal calculus, left   . History of supraventricular tachycardia pt has seen a cardiologist in several years    S/P  ABLATION 2007-- per pt no issue since , denies palpitations  . S/P ablation operation for arrhythmia     2007 for SVT  . Arthritis     fingers  . Wears glasses   . History of abnormal cervical Pap smear   . History of kidney stones    Prior to Admission medications --- noncurrent   Medication Sig Start Date End Date Taking? Authorizing Provider   Allergies  Allergen Reactions  . Penicillins Swelling  . Toradol [Ketorolac Tromethamine] Swelling   Review of Systems  Constitutional: Negative for fever.  Gastrointestinal: Positive for abdominal pain.  Genitourinary: Positive for frequency, flank pain and menstrual problem. Negative for dysuria.      Objective:  BP 90/60 mmHg  Pulse 82  Temp(Src) 98.3 F (36.8 C) (Oral)  Resp 16  Ht 5\' 4"  (1.626 m)  Wt 175 lb (79.379 kg)  BMI 30.02 kg/m2   SpO2 97%  LMP 01/19/2015 Physical Exam  Constitutional: She is oriented to person, place, and time. She appears well-developed and well-nourished. No distress.  HENT:  Head: Normocephalic and atraumatic.  Eyes: Pupils are equal, round, and reactive to light.  Neck: Normal range of motion.  Cardiovascular: Normal rate and regular rhythm.   Pulmonary/Chest: Effort normal. No respiratory distress.  Abdominal: Soft. Bowel sounds are normal. She exhibits no distension. There is no tenderness. There is no guarding.  Mild CVA tenderness to percussion on the right  Musculoskeletal: Normal range of motion.  Neurological: She is alert and oriented to person, place, and time.  Skin: Skin is warm and dry.  Psychiatric: She has a normal mood and affect. Her behavior is normal.  Nursing note and vitals reviewed.  Results for orders placed or performed in visit on 04/07/15  POCT urine pregnancy  Result Value Ref Range   Preg Test, Ur Positive (A) Negative  POCT urinalysis dipstick  Result Value Ref Range   Color, UA yellow    Clarity, UA clear    Glucose, UA neg    Bilirubin, UA neg    Ketones, UA trace    Spec Grav, UA 1.020    Blood, UA neg    pH, UA 7.0    Protein, UA neg    Urobilinogen, UA 0.2  Nitrite, UA neg    Leukocytes, UA Negative Negative  POCT UA - Microscopic Only  Result Value Ref Range   WBC, Ur, HPF, POC 1-3    RBC, urine, microscopic 0-1    Bacteria, U Microscopic trace    Mucus, UA trace    Epithelial cells, urine per micros 1-2    Crystals, Ur, HPF, POC neg    Casts, Ur, LPF, POC neg    Yeast, UA neg        Assessment & Plan:  Frequency of urination - Plan: POCT urinalysis dipstick, POCT UA - Microscopic Only  Delayed menses - Plan: POCT urine pregnancy  Now Pregnant---ref to [facility] for care--f/u at once if RLQ pain increases   I have completed the patient encounter in its entirety as documented by the scribe, with editing by me where  necessary. Analeia Ismael P. Merla Riches, M.D.

## 2015-10-07 IMAGING — CR DG ABDOMEN 1V
1 series · 1 of 1 positions shown · non-contrast
Comparison: None.

CLINICAL DATA: 28-year-old female with left flank pain and mild
hematuria. Initial encounter.

EXAM:
ABDOMEN - 1 VIEW

[AP]
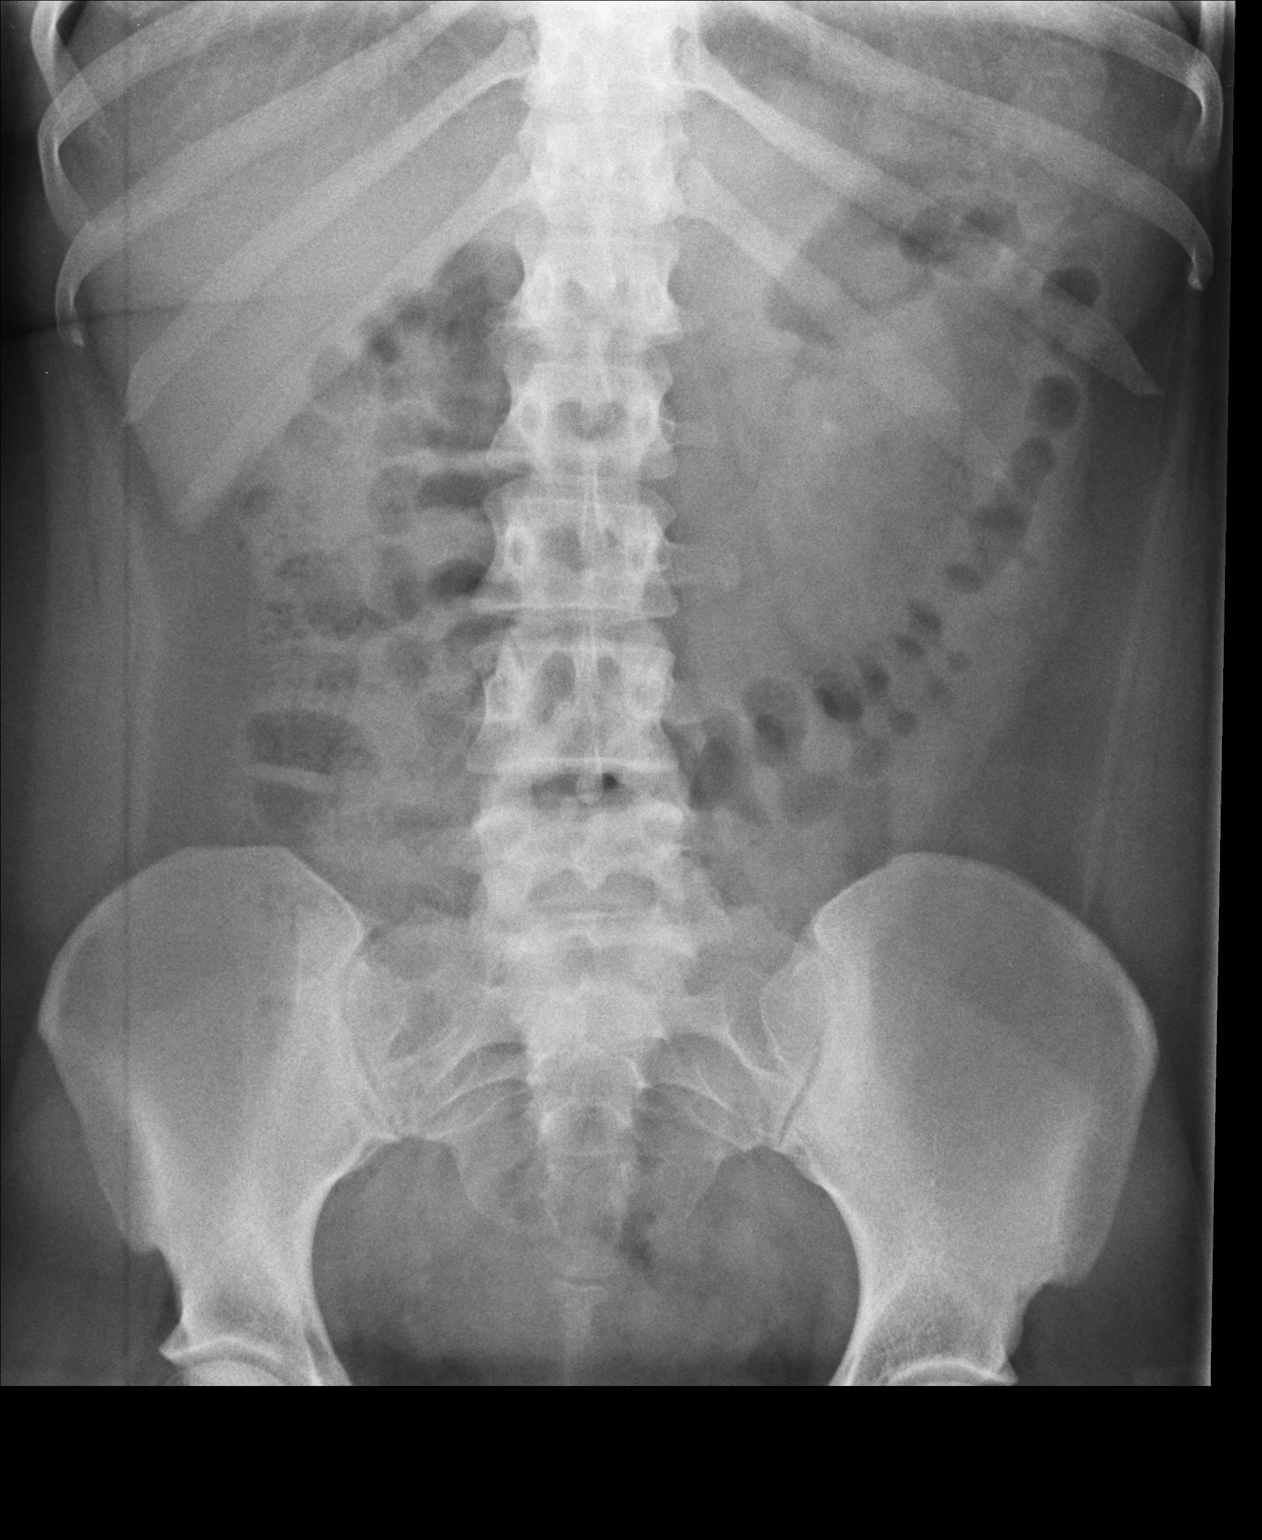

[1 of 1 positions shown; findings below may reference images not displayed]

FINDINGS: 3.5 mm radiopaque structure left upper quadrant possibly
representing a left renal calculus.

Nonspecific bowel gas pattern.

The possibility of free intraperitoneal air cannot be assessed on a
supine view.

Minimal curvature lumbar spine convex right.
IMPRESSION: 3.5 mm radiopaque structure left upper quadrant possibly
representing a left renal calculus.

## 2015-10-13 IMAGING — CT CT ABD-PELV W/O CM
2 of 4 series · 16 of 46 positions shown, 18 images · non-contrast
Comparison: None.

CLINICAL DATA: Left flank pain starting [REDACTED], hematuria

EXAM:
CT ABDOMEN AND PELVIS WITHOUT CONTRAST
TECHNIQUE: Multidetector CT imaging of the abdomen and pelvis was performed
following the standard protocol without IV contrast.

[Series 2: stone study 5.0 i30f 1 · axial · 0.77mm/px · z∈[+376,+781]mm · 13 of 89 slices shown, 15 images]
[im 4/89  soft-tissue]
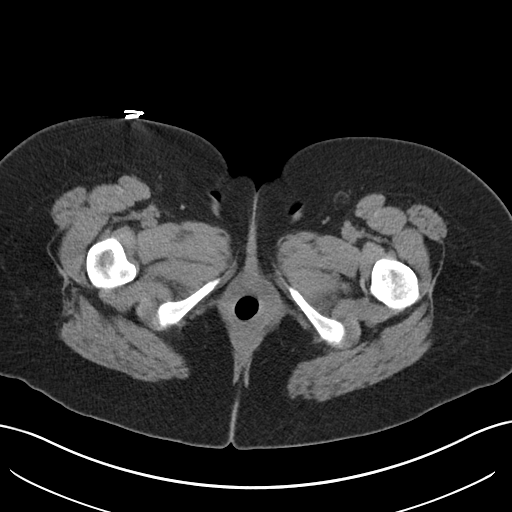
[im 4/89  bone]
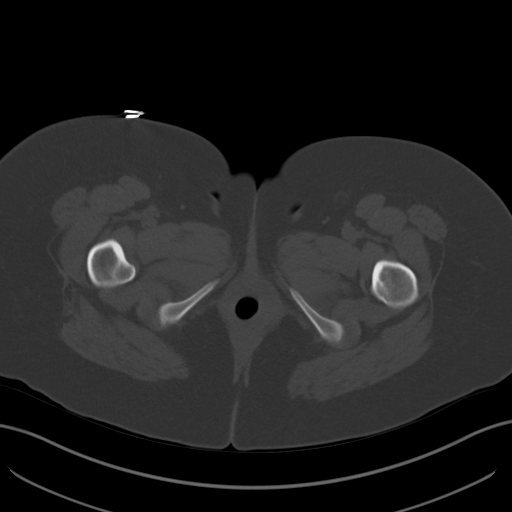
[im 12/89  soft-tissue]
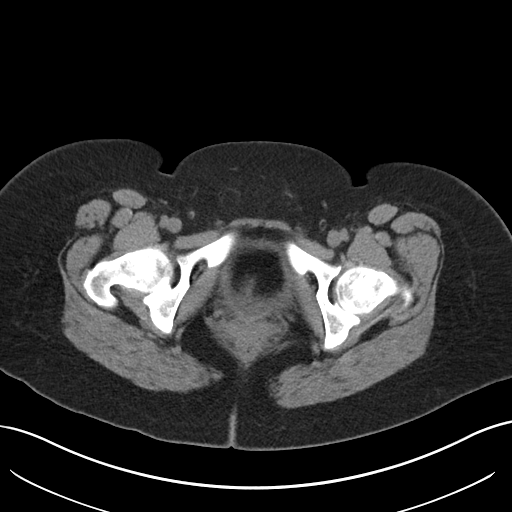
[im 19/89  soft-tissue]
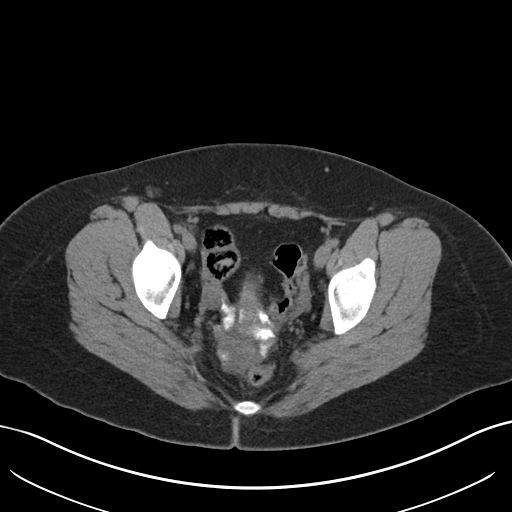
[im 26/89  soft-tissue]
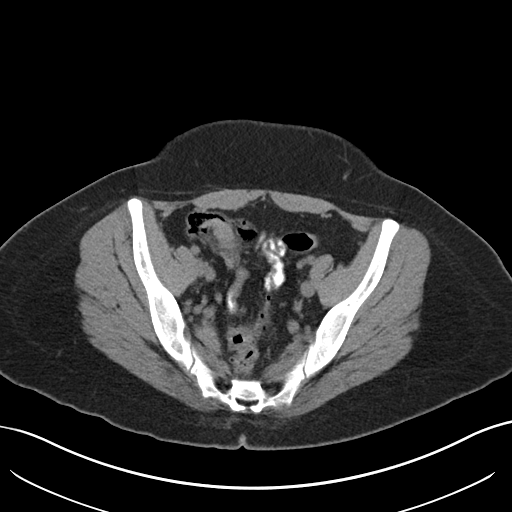
[im 30/89  soft-tissue]
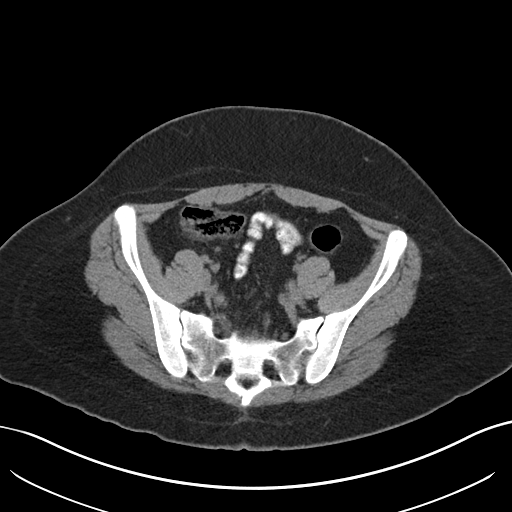
[im 37/89  soft-tissue]
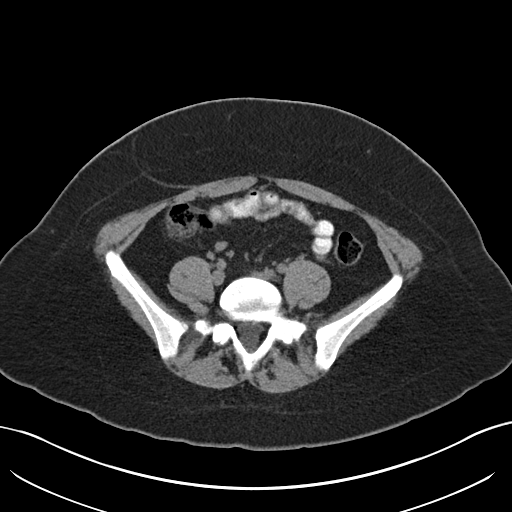
[im 45/89  soft-tissue]
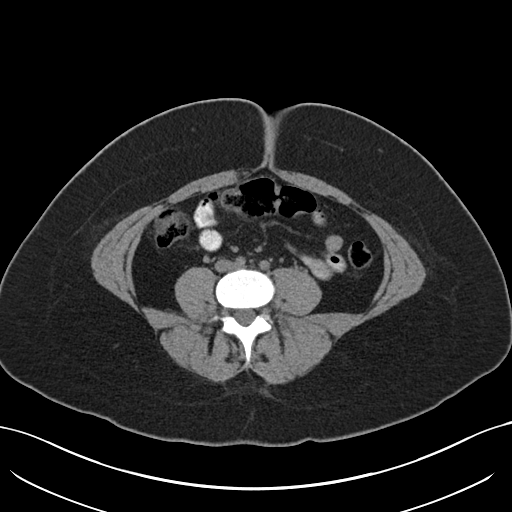
[im 52/89  soft-tissue]
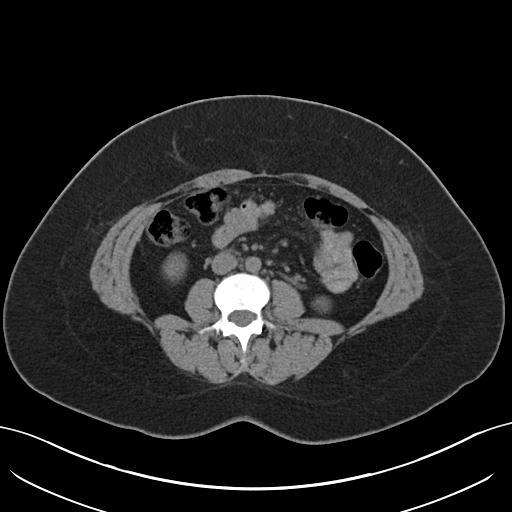
[im 59/89  soft-tissue]
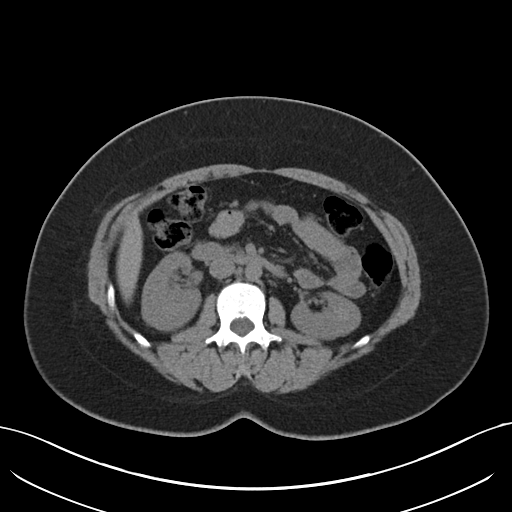
[im 59/89  bone]
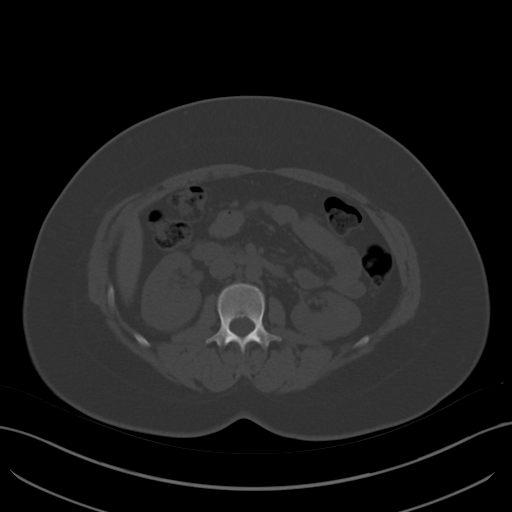
[im 63/89  soft-tissue]
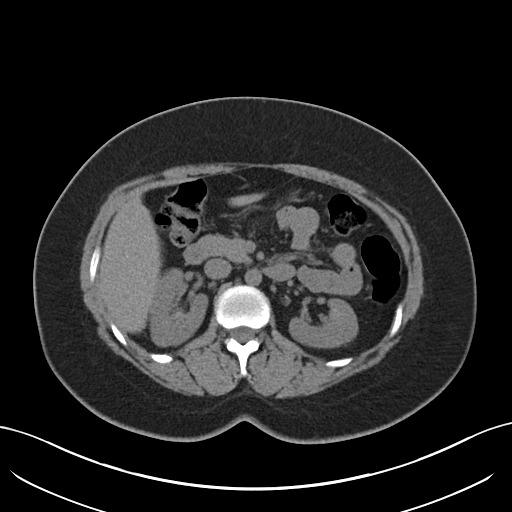
[im 70/89  soft-tissue]
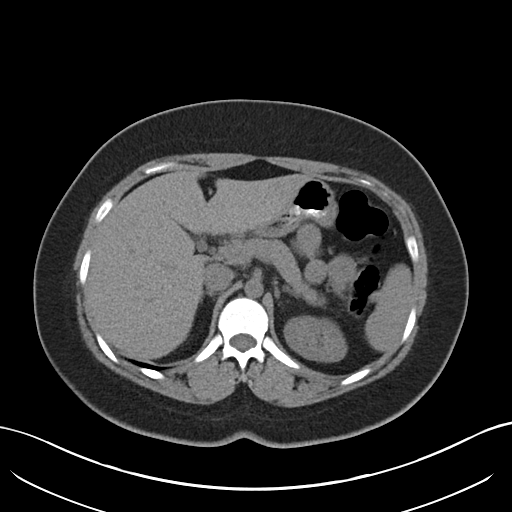
[im 78/89  soft-tissue]
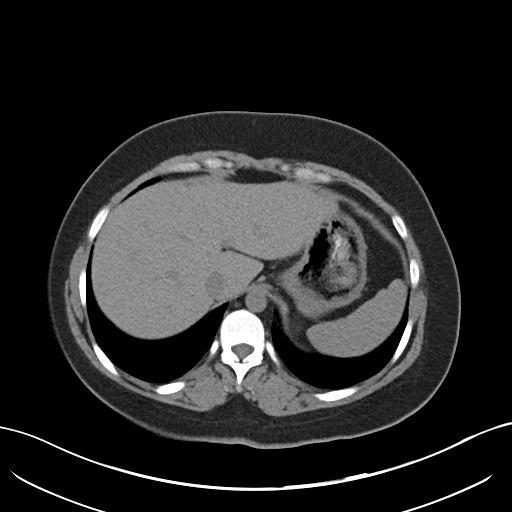
[im 85/89  soft-tissue]
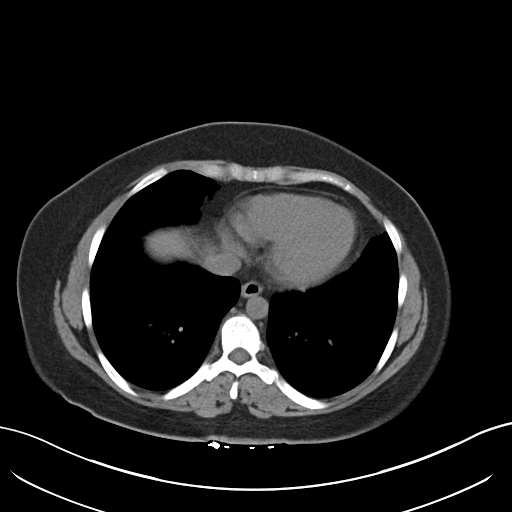

[Series 5: coronal soft tissue · coronal · 0.67mm/px · 3 of 94 slices shown]
[im 32/94  soft-tissue]
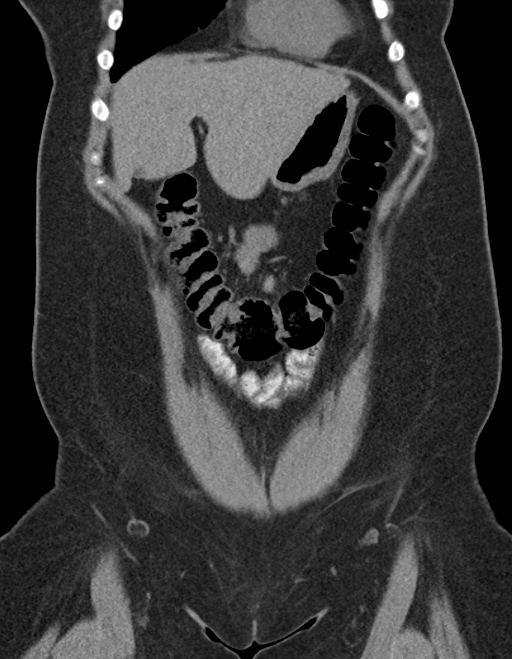
[im 42/94  soft-tissue]
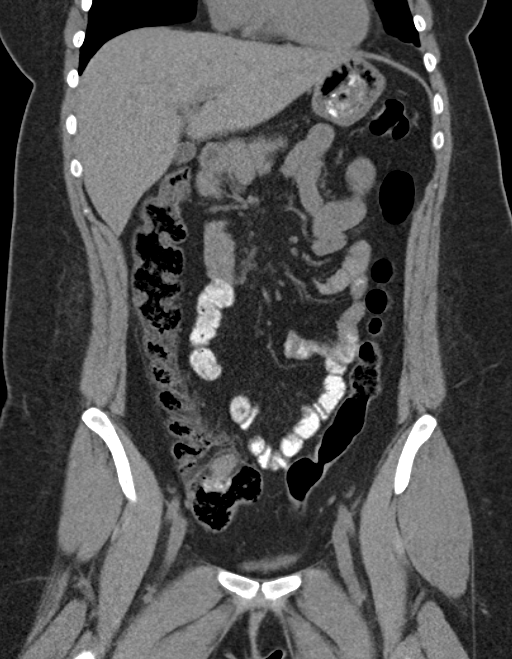
[im 52/94  soft-tissue]
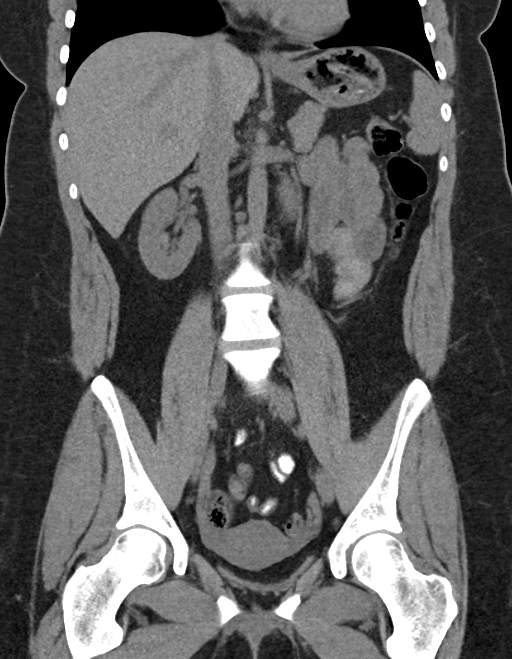

[16 of 46 positions shown; findings below may reference images not displayed]

FINDINGS: Lung bases are unremarkable. There are streaky artifacts from
patient large body habitus. Sagittal images of the spine are
unremarkable. Gallbladder is contracted without evidence of
calcified gallstones. No intrahepatic biliary ductal dilatation.
Unenhanced pancreas, spleen and right adrenal is unremarkable. There
is a probable low-density adenoma left adrenal gland measures
cm. A linear calcification within the adenoma may represent sequela
from prior hemorrhage.

Unenhanced kidneys are symmetrical in size. There is calcified
nonobstructive calculus in left renal pelvis measures 5.5 mm. No
perinephric stranding. No hydronephrosis or hydroureter. No
calcified ureteral calculi are noted bilaterally.

No small bowel obstruction. Abdominal aorta is unremarkable. No
ascites or free air. No adenopathy. There is a low lying cecum.
Stool noted within cecum. No pericecal inflammation. The appendix is
not identified. Small nonspecific lymph nodes are noted in right
lower quadrant mesentery.

Unenhanced uterus and adnexa are unremarkable. No inguinal
adenopathy.
IMPRESSION: 1. There is calcified nonobstructive calculus in left renal pelvis
measures 5.5 mm. No hydronephrosis or hydroureter. No perinephric
stranding.
2. No ureteral calcifications are noted. Probable adenoma left
adrenal gland measures 3.2 cm.
3. Low lying cecum. No pericecal inflammation. Stool noted within
within cecum. The appendix is not identified.
4. No small bowel obstruction.

## 2016-02-16 IMAGING — CR DG ABDOMEN 1V
1 series · 1 of 1 positions shown · non-contrast
Comparison: 12/10/2014

CLINICAL DATA: Left nephrolithiasisa

EXAM:
ABDOMEN - 1 VIEW

[t abdomen supine]
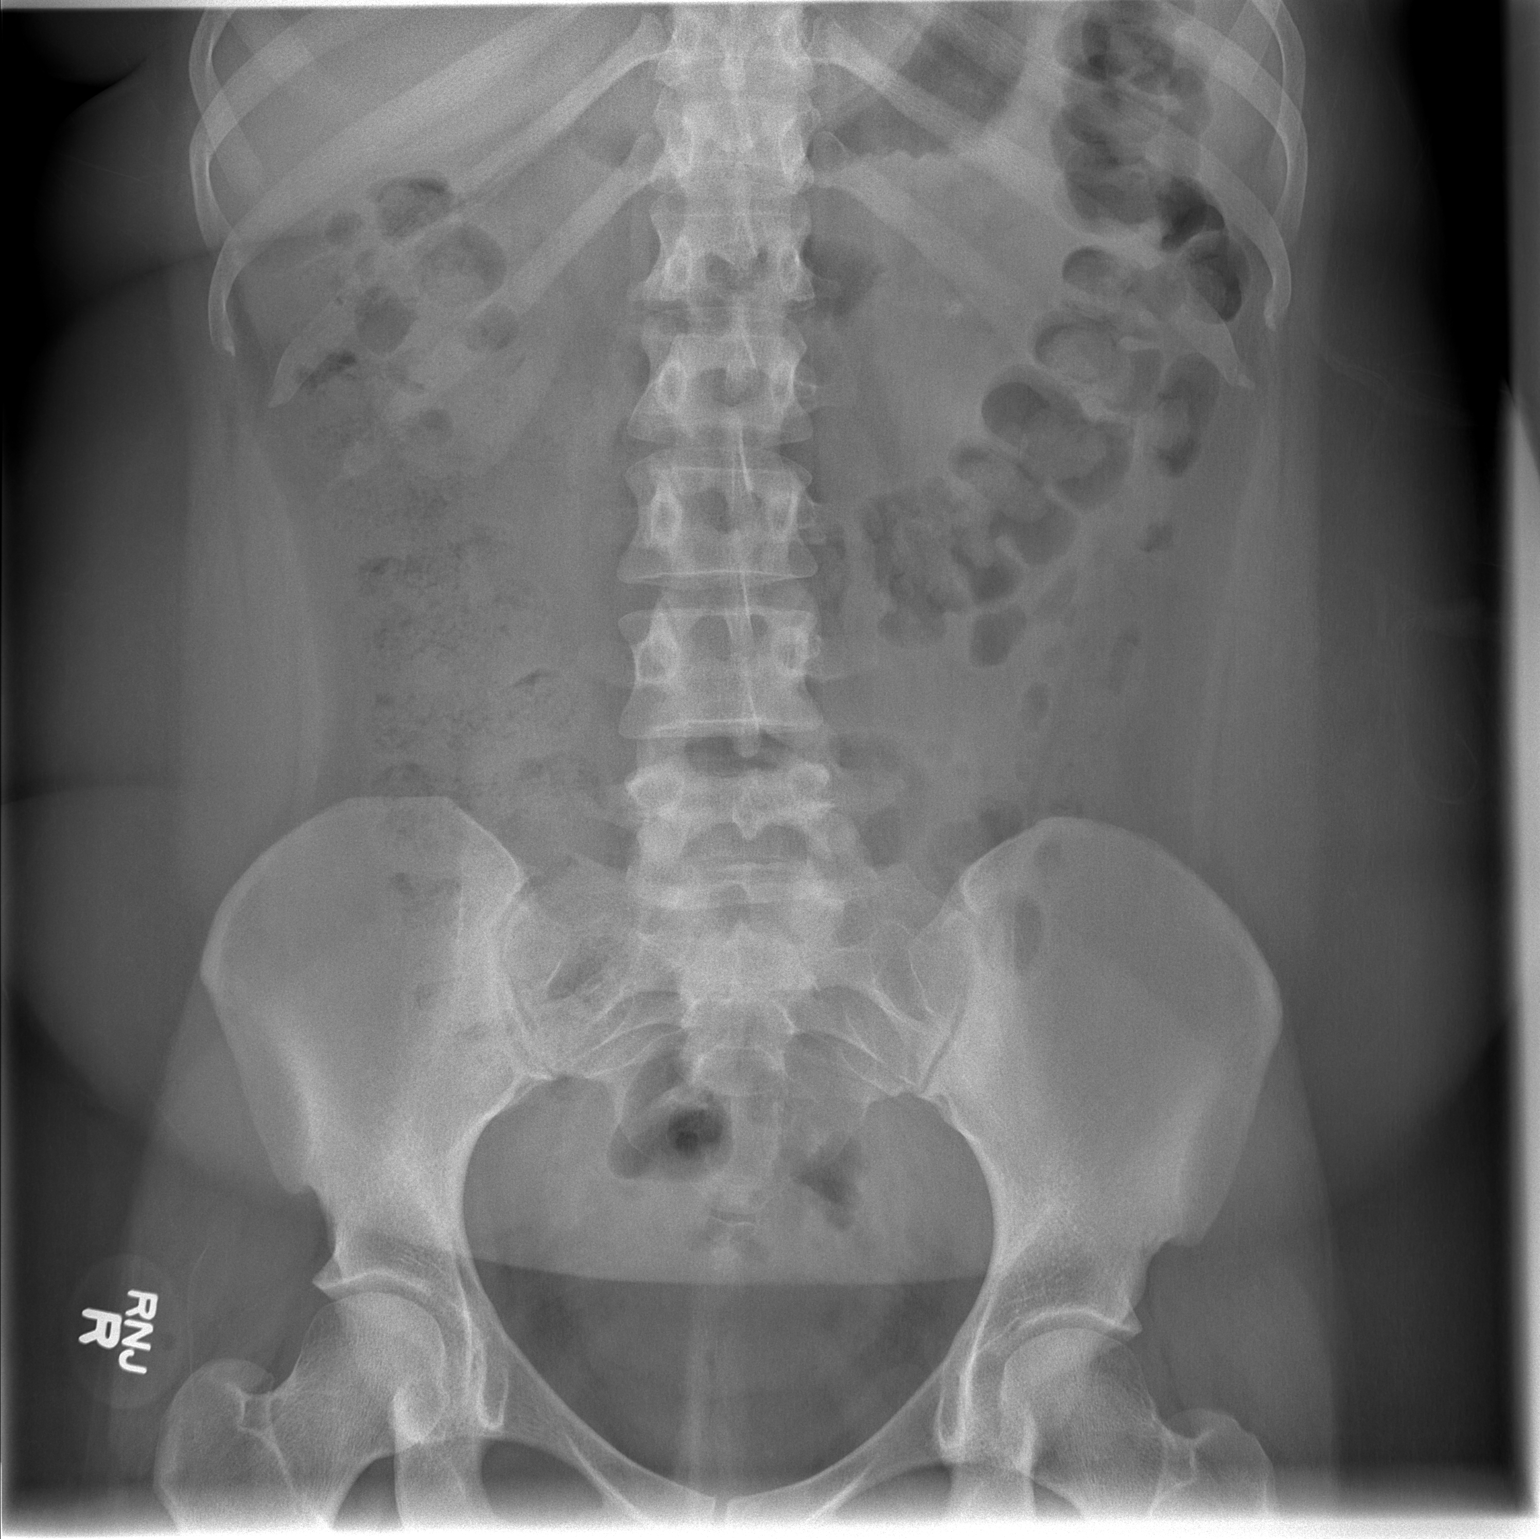

[1 of 1 positions shown; findings below may reference images not displayed]

FINDINGS: The left collecting system calculus is not radiographically visible
and measures approximately 5 mm. No ureteral calculi are evident.
The abdominal gas pattern is normal.
IMPRESSION: Left nephrolithiasis.
